# Patient Record
Sex: Male | Born: 1965 | Race: Black or African American | Hispanic: No | Marital: Single | Smoking: Former smoker
Health system: Southern US, Community
[De-identification: ages and names within clinical notes are randomized; demographics above are authoritative.]

## PROBLEM LIST (undated history)

## (undated) DIAGNOSIS — Z8719 Personal history of other diseases of the digestive system: Secondary | ICD-10-CM

## (undated) DIAGNOSIS — M1990 Unspecified osteoarthritis, unspecified site: Secondary | ICD-10-CM

## (undated) DIAGNOSIS — Z8711 Personal history of peptic ulcer disease: Secondary | ICD-10-CM

## (undated) DIAGNOSIS — I428 Other cardiomyopathies: Secondary | ICD-10-CM

## (undated) DIAGNOSIS — C801 Malignant (primary) neoplasm, unspecified: Secondary | ICD-10-CM

## (undated) DIAGNOSIS — I213 ST elevation (STEMI) myocardial infarction of unspecified site: Secondary | ICD-10-CM

## (undated) DIAGNOSIS — K219 Gastro-esophageal reflux disease without esophagitis: Secondary | ICD-10-CM

## (undated) DIAGNOSIS — Z87442 Personal history of urinary calculi: Secondary | ICD-10-CM

## (undated) DIAGNOSIS — I10 Essential (primary) hypertension: Secondary | ICD-10-CM

## (undated) HISTORY — PX: CARDIAC CATHETERIZATION: SHX172

---

## 1991-09-13 HISTORY — PX: EYE SURGERY: SHX253

## 2015-09-14 ENCOUNTER — Encounter (HOSPITAL_COMMUNITY): Payer: Self-pay | Admitting: *Deleted

## 2015-09-14 ENCOUNTER — Emergency Department (HOSPITAL_COMMUNITY): Payer: Self-pay

## 2015-09-14 ENCOUNTER — Emergency Department (HOSPITAL_COMMUNITY)
Admission: EM | Admit: 2015-09-14 | Discharge: 2015-09-14 | Disposition: A | Discharge status: Home / Self Care | Payer: Self-pay | Attending: Emergency Medicine | Admitting: Emergency Medicine

## 2015-09-14 DIAGNOSIS — R1032 Left lower quadrant pain: Secondary | ICD-10-CM | POA: Insufficient documentation

## 2015-09-14 DIAGNOSIS — I10 Essential (primary) hypertension: Secondary | ICD-10-CM | POA: Insufficient documentation

## 2015-09-14 DIAGNOSIS — Z8719 Personal history of other diseases of the digestive system: Secondary | ICD-10-CM | POA: Insufficient documentation

## 2015-09-14 DIAGNOSIS — Z7982 Long term (current) use of aspirin: Secondary | ICD-10-CM | POA: Insufficient documentation

## 2015-09-14 DIAGNOSIS — Z79899 Other long term (current) drug therapy: Secondary | ICD-10-CM | POA: Insufficient documentation

## 2015-09-14 DIAGNOSIS — Z87891 Personal history of nicotine dependence: Secondary | ICD-10-CM | POA: Insufficient documentation

## 2015-09-14 HISTORY — DX: Gastro-esophageal reflux disease without esophagitis: K21.9

## 2015-09-14 HISTORY — DX: Essential (primary) hypertension: I10

## 2015-09-14 LAB — CBC
HCT: 43.5 % (ref 39.0–52.0)
HEMOGLOBIN: 15.6 g/dL (ref 13.0–17.0)
MCH: 30.3 pg (ref 26.0–34.0)
MCHC: 35.9 g/dL (ref 30.0–36.0)
MCV: 84.5 fL (ref 78.0–100.0)
PLATELETS: 236 10*3/uL (ref 150–400)
RBC: 5.15 MIL/uL (ref 4.22–5.81)
RDW: 12.9 % (ref 11.5–15.5)
WBC: 6.2 10*3/uL (ref 4.0–10.5)

## 2015-09-14 LAB — COMPREHENSIVE METABOLIC PANEL
ALK PHOS: 55 U/L (ref 38–126)
ALT: 26 U/L (ref 17–63)
ANION GAP: 9 (ref 5–15)
AST: 29 U/L (ref 15–41)
Albumin: 3.9 g/dL (ref 3.5–5.0)
BUN: 17 mg/dL (ref 6–20)
CALCIUM: 9.3 mg/dL (ref 8.9–10.3)
CO2: 24 mmol/L (ref 22–32)
CREATININE: 1.23 mg/dL (ref 0.61–1.24)
Chloride: 108 mmol/L (ref 101–111)
Glucose, Bld: 110 mg/dL — ABNORMAL HIGH (ref 65–99)
Potassium: 4.5 mmol/L (ref 3.5–5.1)
SODIUM: 141 mmol/L (ref 135–145)
Total Bilirubin: 1 mg/dL (ref 0.3–1.2)
Total Protein: 6.6 g/dL (ref 6.5–8.1)

## 2015-09-14 LAB — URINALYSIS, ROUTINE W REFLEX MICROSCOPIC
BILIRUBIN URINE: NEGATIVE
GLUCOSE, UA: NEGATIVE mg/dL
HGB URINE DIPSTICK: NEGATIVE
KETONES UR: NEGATIVE mg/dL
LEUKOCYTES UA: NEGATIVE
NITRITE: NEGATIVE
PROTEIN: NEGATIVE mg/dL
SPECIFIC GRAVITY, URINE: 1.023 (ref 1.005–1.030)
pH: 6.5 (ref 5.0–8.0)

## 2015-09-14 LAB — LIPASE, BLOOD: LIPASE: 31 U/L (ref 11–51)

## 2015-09-14 MED ORDER — OXYCODONE-ACETAMINOPHEN 5-325 MG PO TABS: 1.0000 | ORAL_TABLET | ORAL | Status: DC | PRN

## 2015-09-14 MED ORDER — IOHEXOL 300 MG/ML  SOLN
100.0000 mL | Freq: Once | INTRAMUSCULAR | Status: AC | PRN
  Administered 2015-09-14: 100 mL via INTRAVENOUS

## 2015-09-14 NOTE — ED Notes (Signed)
Pt c/o abd pain x 30 days, worse after eating. States that his PCP saw him but did not order any tests or prescribe any medicine and told him that his pain was related to his acid reflux. .states he has diarrhea at times.
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
## 2015-09-14 NOTE — Discharge Instructions (Signed)
Test showed no life-threatening condition. Medication for pain. Follow-up your primary care doctor.

## 2017-02-02 IMAGING — CT CT ABD-PELV W/ CM
2 of 5 series · 11 of 46 positions shown, 12 images · IV contrast (Iodine)
Comparison: 04/20/2015.

CLINICAL DATA: Abdominal pain over the past month, worse after
eating. Occasional diarrhea. Left abdominal pain.

EXAM:
CT ABDOMEN AND PELVIS WITH CONTRAST
TECHNIQUE: Multidetector CT imaging of the abdomen and pelvis was performed
using the standard protocol following bolus administration of
intravenous contrast.
CONTRAST:  100mL OMNIPAQUE IOHEXOL 300 MG/ML  SOLN

[Series 201: routine, idose (2) · axial · 0.77mm/px · z∈[-480,-90]mm · 8 of 100 slices shown, 9 images]
[im 11/100  soft-tissue]
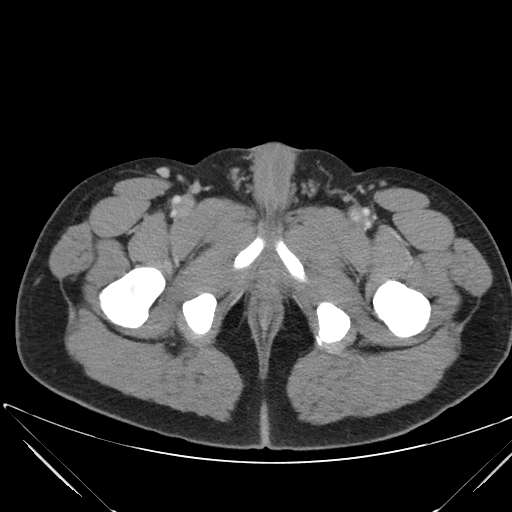
[im 11/100  bone]
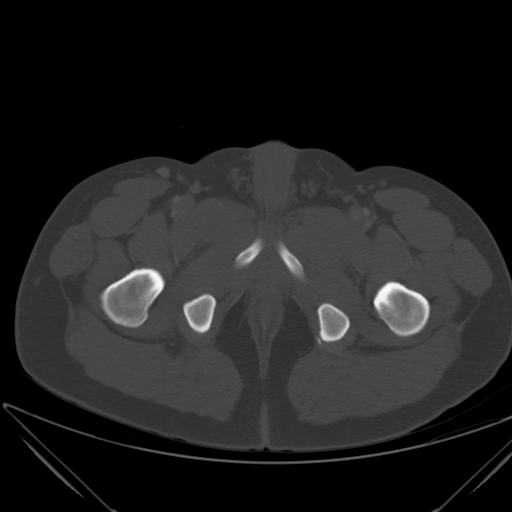
[im 21/100  soft-tissue]
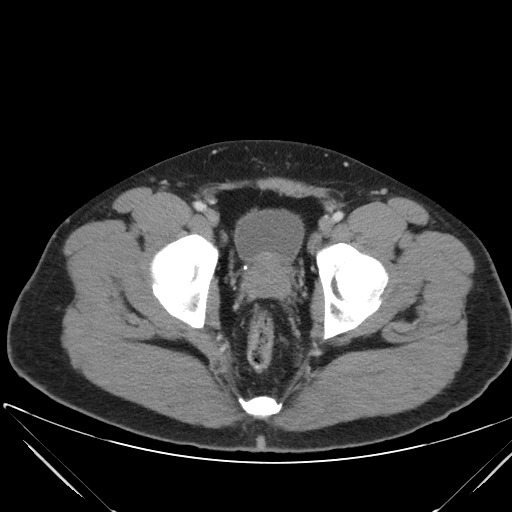
[im 32/100  soft-tissue]
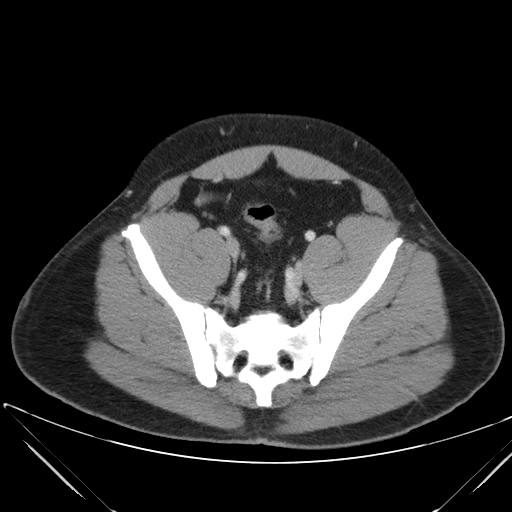
[im 42/100  soft-tissue]
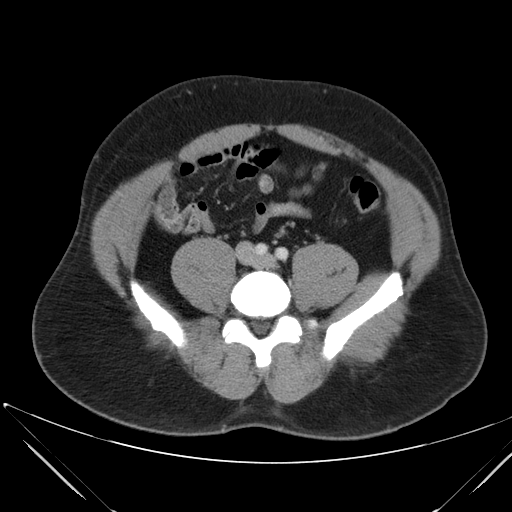
[im 58/100  soft-tissue]
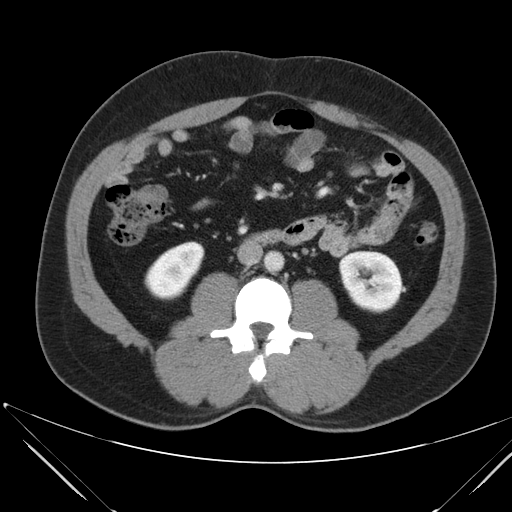
[im 68/100  soft-tissue]
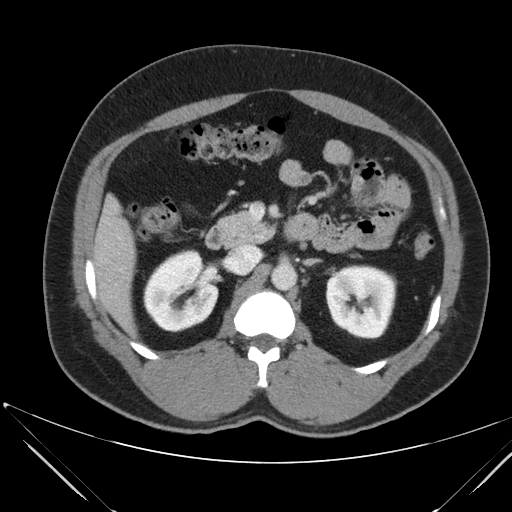
[im 79/100  soft-tissue]
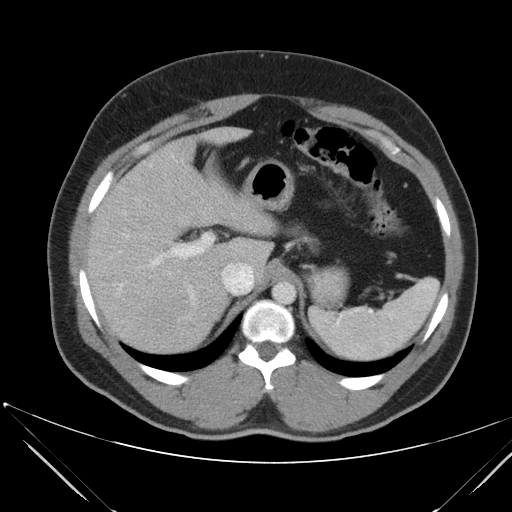
[im 89/100  soft-tissue]
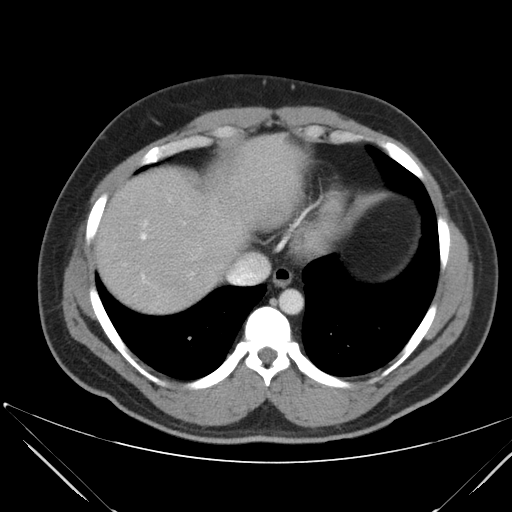

[Series 203: coronals, idose (2) · coronal · 0.45mm/px · 3 of 151 slices shown]
[im 51/151  soft-tissue]
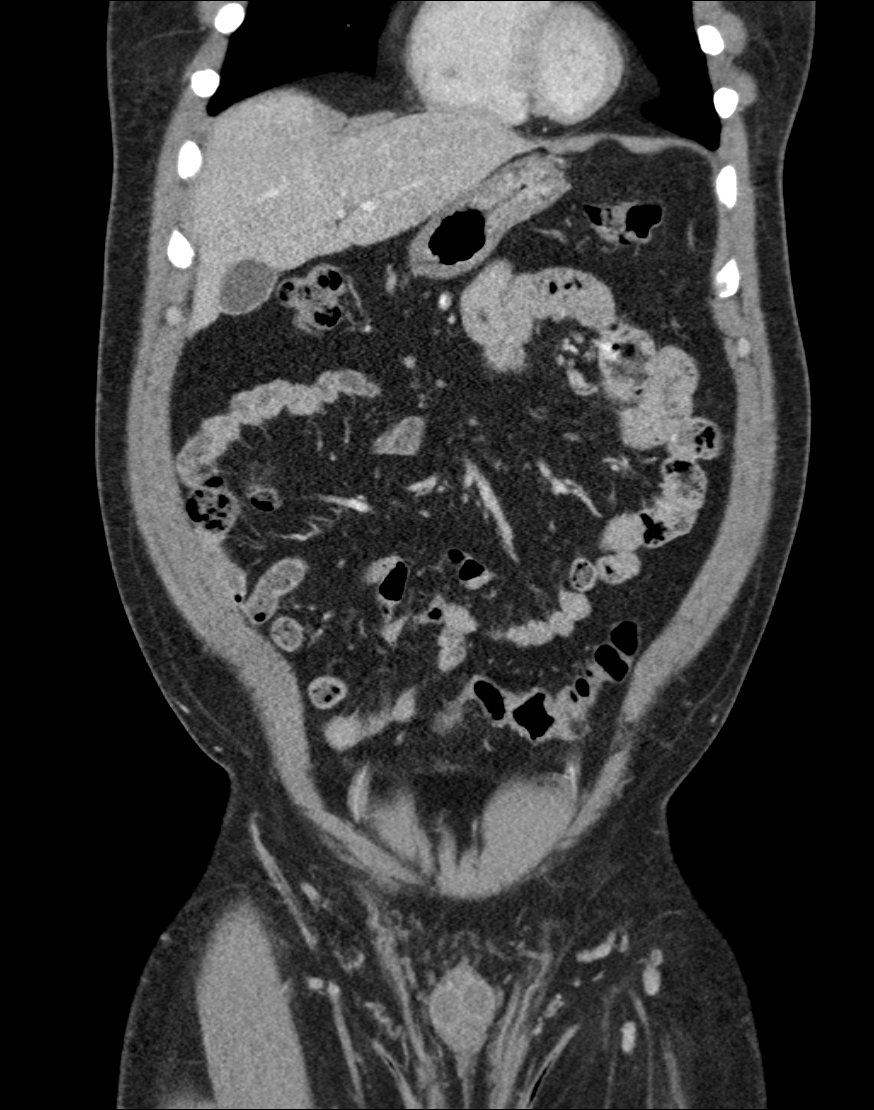
[im 67/151  soft-tissue]
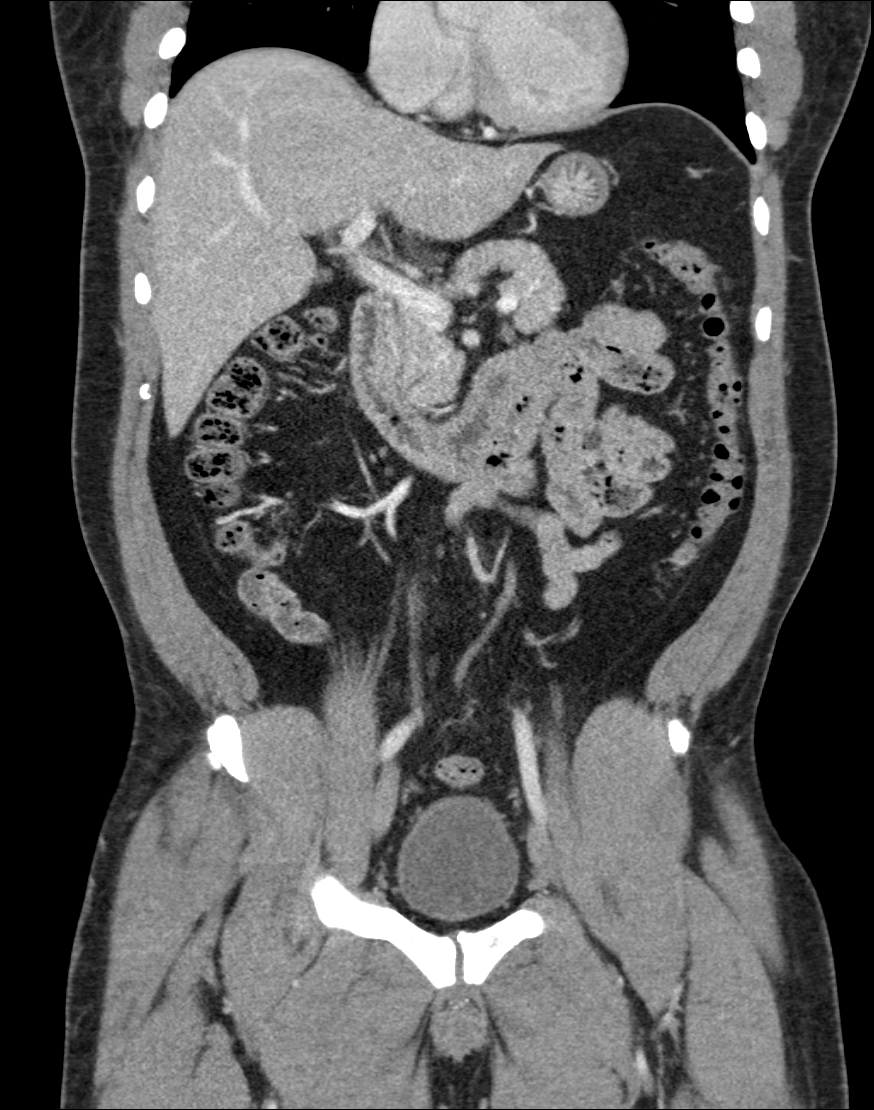
[im 84/151  soft-tissue]
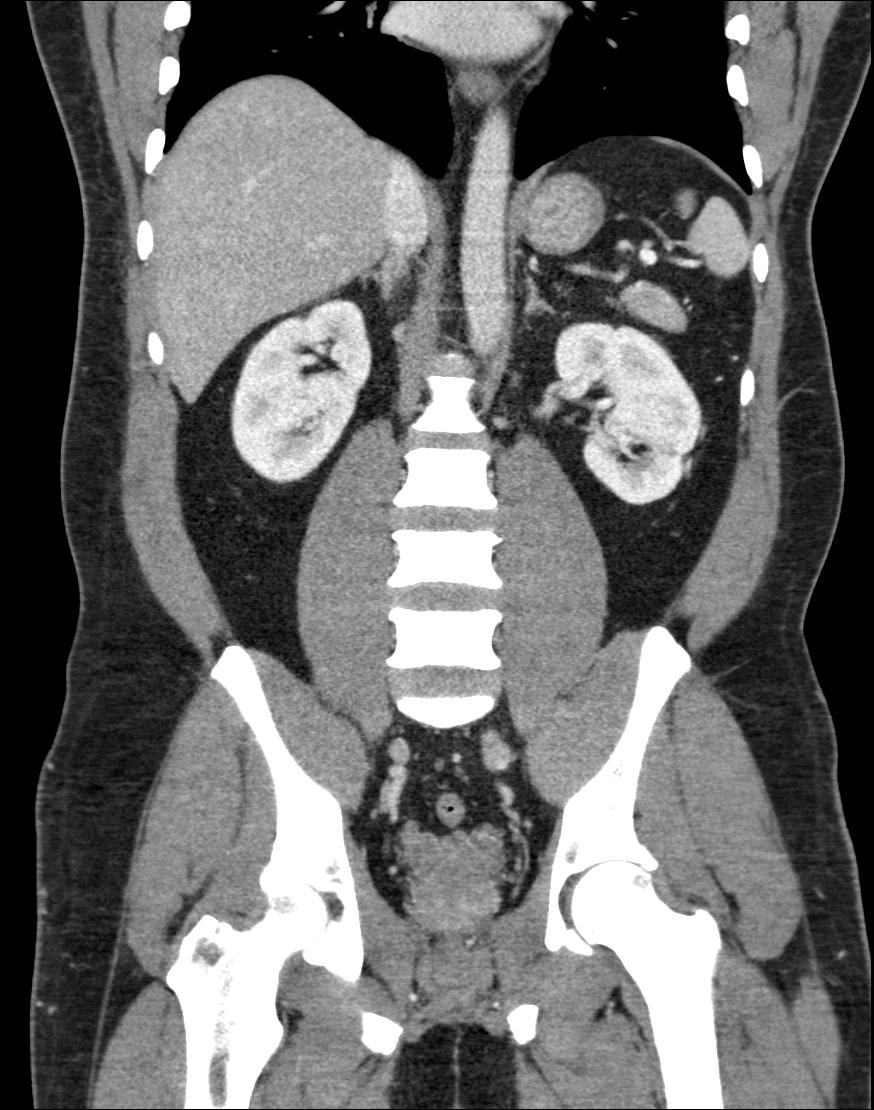

[11 of 46 positions shown; findings below may reference images not displayed]

FINDINGS: Lower chest:  Unremarkable.  No distal esophageal wall thickening.

Hepatobiliary: Unremarkable

Pancreas: Unremarkable

Spleen: Unremarkable

Adrenals/Urinary Tract: Unremarkable

Stomach/Bowel: Sigmoid colon diverticulosis without active
diverticulitis. Appendix normal. Scattered diverticula of the
transverse and descending colon.

Vascular/Lymphatic: Unremarkable

Reproductive: Unremarkable

Other: No supplemental non-categorized findings.

Musculoskeletal: Partially transitional S1 vertebra. Borderline left
foraminal stenosis at L5-S1 due to disc bulge and facet arthropathy.
IMPRESSION: 1. Sigmoid diverticulosis with scattered diverticula of the
transverse and descending colon, but no active diverticulitis.
2. No distal esophageal wall thickening is identified to suggest
esophagitis.
3. Borderline left foraminal stenosis at L5-S1 due to disc bulge and
facet arthropathy.

## 2017-09-13 ENCOUNTER — Observation Stay (HOSPITAL_COMMUNITY)
Admission: AD | Admit: 2017-09-13 | Discharge: 2017-09-14 | Disposition: A | Discharge status: Home / Self Care | Payer: BLUE CROSS/BLUE SHIELD | Source: Other Acute Inpatient Hospital | Attending: Cardiovascular Disease | Admitting: Cardiovascular Disease

## 2017-09-13 ENCOUNTER — Other Ambulatory Visit: Payer: Self-pay

## 2017-09-13 ENCOUNTER — Encounter (HOSPITAL_COMMUNITY): Payer: Self-pay | Admitting: Cardiovascular Disease

## 2017-09-13 ENCOUNTER — Encounter (HOSPITAL_COMMUNITY)
Admission: AD | Disposition: A | Discharge status: Home / Self Care | Payer: Self-pay | Source: Other Acute Inpatient Hospital | Attending: Cardiovascular Disease

## 2017-09-13 DIAGNOSIS — R072 Precordial pain: Secondary | ICD-10-CM

## 2017-09-13 DIAGNOSIS — Z8719 Personal history of other diseases of the digestive system: Secondary | ICD-10-CM

## 2017-09-13 DIAGNOSIS — Z79899 Other long term (current) drug therapy: Secondary | ICD-10-CM | POA: Insufficient documentation

## 2017-09-13 DIAGNOSIS — Z7982 Long term (current) use of aspirin: Secondary | ICD-10-CM | POA: Diagnosis not present

## 2017-09-13 DIAGNOSIS — R0789 Other chest pain: Secondary | ICD-10-CM

## 2017-09-13 DIAGNOSIS — R079 Chest pain, unspecified: Secondary | ICD-10-CM | POA: Diagnosis present

## 2017-09-13 DIAGNOSIS — R9431 Abnormal electrocardiogram [ECG] [EKG]: Secondary | ICD-10-CM | POA: Diagnosis not present

## 2017-09-13 DIAGNOSIS — Z8711 Personal history of peptic ulcer disease: Secondary | ICD-10-CM

## 2017-09-13 DIAGNOSIS — I213 ST elevation (STEMI) myocardial infarction of unspecified site: Secondary | ICD-10-CM

## 2017-09-13 DIAGNOSIS — K219 Gastro-esophageal reflux disease without esophagitis: Secondary | ICD-10-CM | POA: Diagnosis present

## 2017-09-13 DIAGNOSIS — Z79891 Long term (current) use of opiate analgesic: Secondary | ICD-10-CM | POA: Insufficient documentation

## 2017-09-13 DIAGNOSIS — I10 Essential (primary) hypertension: Secondary | ICD-10-CM | POA: Diagnosis present

## 2017-09-13 DIAGNOSIS — Z87891 Personal history of nicotine dependence: Secondary | ICD-10-CM | POA: Insufficient documentation

## 2017-09-13 HISTORY — DX: Unspecified osteoarthritis, unspecified site: M19.90

## 2017-09-13 HISTORY — DX: Personal history of other diseases of the digestive system: Z87.19

## 2017-09-13 HISTORY — DX: Other cardiomyopathies: I42.8

## 2017-09-13 HISTORY — PX: LEFT HEART CATH AND CORONARY ANGIOGRAPHY: CATH118249

## 2017-09-13 HISTORY — DX: ST elevation (STEMI) myocardial infarction of unspecified site: I21.3

## 2017-09-13 HISTORY — DX: Personal history of peptic ulcer disease: Z87.11

## 2017-09-13 SURGERY — LEFT HEART CATH AND CORONARY ANGIOGRAPHY
Anesthesia: LOCAL

## 2017-09-13 MED ORDER — HEPARIN SODIUM (PORCINE) 1000 UNIT/ML IJ SOLN
INTRAMUSCULAR | Status: DC | PRN
Start: 2017-09-13 — End: 2017-09-13
  Administered 2017-09-13: 5000 [IU] via INTRAVENOUS

## 2017-09-13 MED ORDER — MIDAZOLAM HCL 2 MG/2ML IJ SOLN
INTRAMUSCULAR | Status: DC | PRN
  Administered 2017-09-13: 2 mg via INTRAVENOUS

## 2017-09-13 MED ORDER — ONDANSETRON HCL 4 MG/2ML IJ SOLN: 4.0000 mg | Freq: Four times a day (QID) | INTRAMUSCULAR | Status: DC | PRN

## 2017-09-13 MED ORDER — HEPARIN (PORCINE) IN NACL 2-0.9 UNIT/ML-% IJ SOLN
INTRAMUSCULAR | Status: AC
  Filled 2017-09-13: qty 1500

## 2017-09-13 MED ORDER — ASPIRIN 81 MG PO CHEW: 81.0000 mg | CHEWABLE_TABLET | Freq: Every day | ORAL | Status: DC

## 2017-09-13 MED ORDER — HEPARIN SODIUM (PORCINE) 1000 UNIT/ML IJ SOLN
INTRAMUSCULAR | Status: AC
  Filled 2017-09-13: qty 1

## 2017-09-13 MED ORDER — ACETAMINOPHEN 325 MG PO TABS: 650.0000 mg | ORAL_TABLET | ORAL | Status: DC | PRN

## 2017-09-13 MED ORDER — VERAPAMIL HCL 2.5 MG/ML IV SOLN
INTRAVENOUS | Status: DC | PRN
  Administered 2017-09-13: 10 mL via INTRA_ARTERIAL

## 2017-09-13 MED ORDER — SODIUM CHLORIDE 0.9 % IV SOLN
INTRAVENOUS | Status: AC
  Administered 2017-09-13: 16:00:00 via INTRAVENOUS

## 2017-09-13 MED ORDER — LISINOPRIL 5 MG PO TABS: 2.5000 mg | ORAL_TABLET | Freq: Every day | ORAL | Status: DC

## 2017-09-13 MED ORDER — NITROGLYCERIN 1 MG/10 ML FOR IR/CATH LAB
INTRA_ARTERIAL | Status: AC
  Filled 2017-09-13: qty 10

## 2017-09-13 MED ORDER — SODIUM CHLORIDE 0.9% FLUSH: 3.0000 mL | INTRAVENOUS | Status: DC | PRN

## 2017-09-13 MED ORDER — FENTANYL CITRATE (PF) 100 MCG/2ML IJ SOLN
INTRAMUSCULAR | Status: DC | PRN
  Administered 2017-09-13: 50 ug via INTRAVENOUS

## 2017-09-13 MED ORDER — HEPARIN (PORCINE) IN NACL 2-0.9 UNIT/ML-% IJ SOLN
INTRAMUSCULAR | Status: AC | PRN
  Administered 2017-09-13: 1000 mL

## 2017-09-13 MED ORDER — CARVEDILOL 3.125 MG PO TABS
3.1250 mg | ORAL_TABLET | Freq: Two times a day (BID) | ORAL | Status: DC
  Administered 2017-09-13: 3.125 mg via ORAL
  Filled 2017-09-13: qty 1

## 2017-09-13 MED ORDER — VERAPAMIL HCL 2.5 MG/ML IV SOLN
INTRAVENOUS | Status: AC
  Filled 2017-09-13: qty 2

## 2017-09-13 MED ORDER — SODIUM CHLORIDE 0.9% FLUSH
3.0000 mL | Freq: Two times a day (BID) | INTRAVENOUS | Status: DC
  Administered 2017-09-13: 18:00:00 3 mL via INTRAVENOUS

## 2017-09-13 MED ORDER — IOPAMIDOL (ISOVUE-370) INJECTION 76%
INTRAVENOUS | Status: DC | PRN
  Administered 2017-09-13: 120 mL via INTRA_ARTERIAL

## 2017-09-13 MED ORDER — IOPAMIDOL (ISOVUE-370) INJECTION 76%
INTRAVENOUS | Status: AC
  Filled 2017-09-13: qty 50

## 2017-09-13 MED ORDER — MIDAZOLAM HCL 2 MG/2ML IJ SOLN
INTRAMUSCULAR | Status: AC
  Filled 2017-09-13: qty 2

## 2017-09-13 MED ORDER — SODIUM CHLORIDE 0.9 % IV SOLN: 250.0000 mL | INTRAVENOUS | Status: DC | PRN

## 2017-09-13 MED ORDER — LIDOCAINE HCL (PF) 1 % IJ SOLN
INTRAMUSCULAR | Status: DC | PRN
  Administered 2017-09-13: 3 mL

## 2017-09-13 MED ORDER — LIDOCAINE HCL (PF) 1 % IJ SOLN
INTRAMUSCULAR | Status: AC
  Filled 2017-09-13: qty 30

## 2017-09-13 MED ORDER — FENTANYL CITRATE (PF) 100 MCG/2ML IJ SOLN
INTRAMUSCULAR | Status: AC
  Filled 2017-09-13: qty 2

## 2017-09-13 SURGICAL SUPPLY — 14 items
CATH 5FR JL3.5 JR4 ANG PIG MP (CATHETERS) ×2 IMPLANT
CATH INFINITI 5FR AL1 (CATHETERS) ×2 IMPLANT
CATH LAUNCHER 5F RADR (CATHETERS) ×1 IMPLANT
CATHETER LAUNCHER 5F RADR (CATHETERS) ×2
DEVICE RAD COMP TR BAND LRG (VASCULAR PRODUCTS) ×2 IMPLANT
ELECT DEFIB PAD ADLT CADENCE (PAD) ×2 IMPLANT
GLIDESHEATH SLEND SS 6F .021 (SHEATH) ×2 IMPLANT
GUIDEWIRE INQWIRE 1.5J.035X260 (WIRE) ×2 IMPLANT
INQWIRE 1.5J .035X260CM (WIRE) ×4
KIT HEART LEFT (KITS) ×2 IMPLANT
PACK CARDIAC CATHETERIZATION (CUSTOM PROCEDURE TRAY) ×2 IMPLANT
SYR MEDRAD MARK V 150ML (SYRINGE) ×2 IMPLANT
TRANSDUCER W/STOPCOCK (MISCELLANEOUS) ×2 IMPLANT
TUBING CIL FLEX 10 FLL-RA (TUBING) ×2 IMPLANT
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
## 2017-09-13 NOTE — Progress Notes (Signed)
TR BAND REMOVAL  LOCATION:  right radial  DEFLATED PER PROTOCOL:  Yes.    TIME BAND OFF / DRESSING APPLIED:   1700   SITE UPON ARRIVAL:   Level 0  SITE AFTER BAND REMOVAL:  Level 0  CIRCULATION SENSATION AND MOVEMENT:  Within Normal Limits  Yes.    COMMENTS:    

## 2017-09-14 DIAGNOSIS — K219 Gastro-esophageal reflux disease without esophagitis: Secondary | ICD-10-CM | POA: Diagnosis present

## 2017-09-14 DIAGNOSIS — Z8719 Personal history of other diseases of the digestive system: Secondary | ICD-10-CM

## 2017-09-14 DIAGNOSIS — I10 Essential (primary) hypertension: Secondary | ICD-10-CM | POA: Diagnosis present

## 2017-09-14 DIAGNOSIS — R079 Chest pain, unspecified: Secondary | ICD-10-CM | POA: Diagnosis not present

## 2017-09-14 DIAGNOSIS — R072 Precordial pain: Secondary | ICD-10-CM | POA: Diagnosis not present

## 2017-09-14 DIAGNOSIS — R9431 Abnormal electrocardiogram [ECG] [EKG]: Secondary | ICD-10-CM | POA: Diagnosis not present

## 2017-09-14 DIAGNOSIS — Z8711 Personal history of peptic ulcer disease: Secondary | ICD-10-CM

## 2017-09-14 LAB — BASIC METABOLIC PANEL
ANION GAP: 8 (ref 5–15)
BUN: 13 mg/dL (ref 6–20)
CHLORIDE: 105 mmol/L (ref 101–111)
CO2: 24 mmol/L (ref 22–32)
Calcium: 8.8 mg/dL — ABNORMAL LOW (ref 8.9–10.3)
Creatinine, Ser: 1.28 mg/dL — ABNORMAL HIGH (ref 0.61–1.24)
GFR calc Af Amer: >60 mL/min (ref >60)
Glucose, Bld: 91 mg/dL (ref 65–99)
POTASSIUM: 3.9 mmol/L (ref 3.5–5.1)
SODIUM: 137 mmol/L (ref 135–145)

## 2017-09-14 LAB — CBC
HCT: 42.9 % (ref 39.0–52.0)
HEMOGLOBIN: 14.8 g/dL (ref 13.0–17.0)
MCH: 29.6 pg (ref 26.0–34.0)
MCHC: 34.5 g/dL (ref 30.0–36.0)
MCV: 85.8 fL (ref 78.0–100.0)
PLATELETS: 260 10*3/uL (ref 150–400)
RBC: 5 MIL/uL (ref 4.22–5.81)
RDW: 13.6 % (ref 11.5–15.5)
WBC: 7.8 10*3/uL (ref 4.0–10.5)

## 2017-09-14 NOTE — Progress Notes (Signed)
   Progress Note  Patient Name: Gregory Parsons Date of Encounter: 09/14/2017  Primary Cardiologist: Clifton James  Subjective   No chest pain  Inpatient Medications    Scheduled Meds: . aspirin  81 mg Oral Daily  . carvedilol  3.125 mg Oral BID WC  . lisinopril  2.5 mg Oral Daily  . sodium chloride flush  3 mL Intravenous Q12H   Continuous Infusions: . sodium chloride     PRN Meds: sodium chloride, acetaminophen, ondansetron (ZOFRAN) IV, sodium chloride flush   Vital Signs    Vitals:   09/13/17 1930 09/13/17 2000 09/14/17 0420 09/14/17 0731  BP: 122/75  123/71 125/80  Pulse: 83 69 65 67  Resp: 16 18 17  (!) 21  Temp: 98.7 F (37.1 C)  (!) 97.5 F (36.4 C) 97.7 F (36.5 C)  TempSrc: Oral  Oral Oral  SpO2: 98% 98% 100% 98%  Weight:   202 lb 9.6 oz (91.9 kg)     Intake/Output Summary (Last 24 hours) at 09/14/2017 0740 Last data filed at 09/13/2017 2210 Gross per 24 hour  Intake 577.5 ml  Output -  Net 577.5 ml   Filed Weights   09/14/17 0420  Weight: 202 lb 9.6 oz (91.9 kg)    Telemetry    sinus - Personally Reviewed  ECG    Sinus, ST elevation leads 2, AVF, V4-V6-This is early repol and his baseline - Personally Reviewed  Physical Exam   GEN: No acute distress.   Neck: No JVD Cardiac: RRR, no murmurs, rubs, or gallops.  Respiratory: Clear to auscultation bilaterally. GI: Soft, nontender, non-distended  MS: No edema; No deformity. Neuro:  Nonfocal  Psych: Normal affect   Labs    Chemistry Recent Labs  Lab 09/14/17 0347  NA 137  K 3.9  CL 105  CO2 24  GLUCOSE 91  BUN 13  CREATININE 1.28*  CALCIUM 8.8*  GFRNONAA >60  GFRAA >60  ANIONGAP 8     Hematology Recent Labs  Lab 09/14/17 0347  WBC 7.8  RBC 5.00  HGB 14.8  HCT 42.9  MCV 85.8  MCH 29.6  MCHC 34.5  RDW 13.6  PLT 260    Cardiac EnzymesNo results for input(s): TROPONINI in the last 168 hours. No results for input(s): TROPIPOC in the last 168 hours.   BNPNo results for  input(s): BNP, PROBNP in the last 168 hours.   DDimer No results for input(s): DDIMER in the last 168 hours.   Radiology    No results found.  Cardiac Studies   Cardiac cath 09/13/16: No evidence of CAD Normal LV systolic function  Patient Profile     52 y.o. male with h/o of HTN admitted with chest pain. Cardiac cath with no CAD.   Assessment & Plan    1. Chest pain: Pt admitted 09/13/16 following episode of chest pain with abnormal EKG. Emergent cardiac cath with no CAD. No recurrent pain. Chest pain is felt to be non-cardiac. Will discharge home today.   He will need a work note to stay out until Monday due to radial cath. Pt will need a copy of his EKG to keep in his wallet.   For questions or updates, please contact [facility] Please consult [email or web address] for contact info under Cardiology/STEMI.      Signed, Verne Carrow, MD  09/14/2017, 7:40 AM

## 2017-09-14 NOTE — Discharge Summary (Signed)
Discharge Summary    Patient ID: Gregory Parsons,  MRN: [id number], DOB/AGE: 53-Aug-1967 53 y.o.  Admit date: 09/13/2017 Discharge date: 09/14/2017   Primary Care Provider: [facility], [facility] Primary Cardiologist: [facility]  Discharge Diagnoses    Principal Problem:   Chest pain Active Problems:   Abnormal EKG   Precordial pain   Chest pain of unknown etiology   Hypertension   History of stomach ulcers   Acid reflux   Allergies No Known Allergies   History of Present Illness     Gregory Parsons is a 52 y.o. male with a history of HTN and presumed non-ischemic cardiomyopathy with remote tobacco abuse who presented to [facility] ED today with c/o chest pain and dyspnea. EKG with 1 mm ST elevation leads 2, aVF. Code STEMI called by the ED staff. Pt transported to [facility] for cardiac cath.    Hospital Course     Consultants: none  Pt was taken emergently to the cath lab for heart catheterization and was found to have normal coronaries and normal LV function. This chest pain was felt to be non-cardiac in nature. He was observed on telemetry overnight without significant chest pain or arrhythmias. Today, he denies chest pain and is ready for discharge.  Patient seen and examined by Dr. Clifton James today and was stable for discharge. All follow up has been arranged.  _____________  Discharge Vitals Blood pressure 125/80, pulse 67, temperature 97.7 F (36.5 C), temperature source Oral, resp. rate (!) 21, weight 202 lb 9.6 oz (91.9 kg), SpO2 98 %.  Filed Weights   09/14/17 0420  Weight: 202 lb 9.6 oz (91.9 kg)    Labs & Radiologic Studies    CBC Recent Labs    09/14/17 0347  WBC 7.8  HGB 14.8  HCT 42.9  MCV 85.8  PLT 260   Basic Metabolic Panel Recent Labs    40/98/11 0347  NA 137  K 3.9  CL 105  CO2 24  GLUCOSE 91  BUN 13  CREATININE 1.28*  CALCIUM 8.8*   Liver Function Tests No results for input(s): AST, ALT, ALKPHOS, BILITOT, PROT,  ALBUMIN in the last 72 hours. No results for input(s): LIPASE, AMYLASE in the last 72 hours. Cardiac Enzymes No results for input(s): CKTOTAL, CKMB, CKMBINDEX, TROPONINI in the last 72 hours. BNP Invalid input(s): POCBNP D-Dimer No results for input(s): DDIMER in the last 72 hours. Hemoglobin A1C No results for input(s): HGBA1C in the last 72 hours. Fasting Lipid Panel No results for input(s): CHOL, HDL, LDLCALC, TRIG, CHOLHDL, LDLDIRECT in the last 72 hours. Thyroid Function Tests No results for input(s): TSH, T4TOTAL, T3FREE, THYROIDAB in the last 72 hours.  Invalid input(s): FREET3 _____________  No results found.   Diagnostic Studies/Procedures    Left heart cath 09/13/17:  The left ventricular systolic function is normal.  LV end diastolic pressure is normal.  The left ventricular ejection fraction is greater than 65% by visual estimate.  There is no mitral valve regurgitation.   1. No angiographic evidence of CAD 2. Normal LV systolic function 3. Non-cardiac chest pain  Recommendations: Will monitor overnight on telemetry. His chest pain is not felt to be cardiac related. Will continue ASA, beta blocker and Ace-inh. Likely discharge home in the am.    Disposition   Pt is being discharged home today in good condition.  Follow-up Plans & Appointments   Follow up with Dr. Misty [facility] at [facility]  Discharge Instructions    Diet -  low sodium heart healthy   Complete by:  As directed    Discharge instructions   Complete by:  As directed    No driving for 2 days. No lifting over 5 lbs for 1 week. No sexual activity for 1 week. You may return to work in 1 week. Keep procedure site clean & dry. If you notice increased pain, swelling, bleeding or pus, call/return!  You may shower, but no soaking baths/hot tubs/pools for 1 week.   Increase activity slowly   Complete by:  As directed       Discharge Medications   Allergies as of 09/14/2017   No Known Allergies       Medication List    TAKE these medications   aspirin 325 MG tablet Take 325 mg by mouth daily.   carvedilol 3.125 MG tablet Commonly known as:  COREG Take 3.125 mg by mouth 2 (two) times daily with a meal.   lisinopril 2.5 MG tablet Commonly known as:  PRINIVIL,ZESTRIL Take 2.5 mg by mouth daily.          Outstanding Labs/Studies   Follow up with [facility] cardiologist.  Duration of Discharge Encounter   Greater than 30 minutes including physician time.  Signed, Roe Rutherford Duke PA-C 09/14/2017, 9:37 AM

## 2018-05-25 ENCOUNTER — Emergency Department (HOSPITAL_COMMUNITY): Payer: BLUE CROSS/BLUE SHIELD

## 2018-05-25 ENCOUNTER — Emergency Department (HOSPITAL_COMMUNITY)
Admission: EM | Admit: 2018-05-25 | Discharge: 2018-05-25 | Disposition: A | Discharge status: Home / Self Care | Payer: BLUE CROSS/BLUE SHIELD | Attending: Emergency Medicine | Admitting: Emergency Medicine

## 2018-05-25 ENCOUNTER — Encounter (HOSPITAL_COMMUNITY): Payer: Self-pay | Admitting: Emergency Medicine

## 2018-05-25 DIAGNOSIS — Z7982 Long term (current) use of aspirin: Secondary | ICD-10-CM | POA: Diagnosis not present

## 2018-05-25 DIAGNOSIS — I10 Essential (primary) hypertension: Secondary | ICD-10-CM | POA: Insufficient documentation

## 2018-05-25 DIAGNOSIS — R079 Chest pain, unspecified: Secondary | ICD-10-CM | POA: Diagnosis not present

## 2018-05-25 DIAGNOSIS — Z87891 Personal history of nicotine dependence: Secondary | ICD-10-CM | POA: Insufficient documentation

## 2018-05-25 DIAGNOSIS — Z79899 Other long term (current) drug therapy: Secondary | ICD-10-CM | POA: Diagnosis not present

## 2018-05-25 LAB — I-STAT TROPONIN, ED
Troponin i, poc: 0 ng/mL (ref 0.00–0.08)
Troponin i, poc: 0 ng/mL (ref 0.00–0.08)

## 2018-05-25 LAB — CBC
HEMATOCRIT: 41.2 % (ref 39.0–52.0)
HEMOGLOBIN: 14.4 g/dL (ref 13.0–17.0)
MCH: 29.9 pg (ref 26.0–34.0)
MCHC: 35 g/dL (ref 30.0–36.0)
MCV: 85.5 fL (ref 78.0–100.0)
Platelets: 207 10*3/uL (ref 150–400)
RBC: 4.82 MIL/uL (ref 4.22–5.81)
RDW: 12.3 % (ref 11.5–15.5)
WBC: 4.8 10*3/uL (ref 4.0–10.5)

## 2018-05-25 LAB — BASIC METABOLIC PANEL
ANION GAP: 9 (ref 5–15)
BUN: 16 mg/dL (ref 6–20)
CHLORIDE: 108 mmol/L (ref 98–111)
CO2: 20 mmol/L — AB (ref 22–32)
Calcium: 8.7 mg/dL — ABNORMAL LOW (ref 8.9–10.3)
Creatinine, Ser: 1.15 mg/dL (ref 0.61–1.24)
GFR calc Af Amer: >60 mL/min (ref >60)
GFR calc non Af Amer: >60 mL/min (ref >60)
Glucose, Bld: 118 mg/dL — ABNORMAL HIGH (ref 70–99)
POTASSIUM: 4.2 mmol/L (ref 3.5–5.1)
Sodium: 137 mmol/L (ref 135–145)

## 2018-05-25 NOTE — ED Notes (Signed)
Discharge instructions discussed with Pt. Pt verbalized understanding. Pt stable and ambulatory.    

## 2018-05-25 NOTE — ED Notes (Signed)
Patient transported to X-ray 

## 2018-05-25 NOTE — ED Triage Notes (Signed)
Pt here via [facility], pt reports he fell around 1350 but does not remember why and then he began experiencing central CP that radiated to his left arm and back.  EMS reported ST elevation but STEMI was cancelled.  EMS gave 324 ASA, 0.4 Nitro. VS EMS 159/82, P 108, 98% RA, 114 CBG.

## 2018-05-25 NOTE — Discharge Instructions (Addendum)
You were evaluated in the emergency department for some sharp left-sided chest pain.  You had blood work EKG and chest x-ray did not show an obvious cause of your symptoms.  It will be important for you to follow-up with your doctor and return if any worsening symptoms.
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
## 2018-07-19 ENCOUNTER — Other Ambulatory Visit: Payer: Self-pay

## 2018-07-19 ENCOUNTER — Emergency Department (HOSPITAL_COMMUNITY)
Admission: EM | Admit: 2018-07-19 | Discharge: 2018-07-19 | Disposition: A | Discharge status: Home / Self Care | Payer: BLUE CROSS/BLUE SHIELD | Attending: Emergency Medicine | Admitting: Emergency Medicine

## 2018-07-19 ENCOUNTER — Encounter (HOSPITAL_COMMUNITY): Payer: Self-pay | Admitting: *Deleted

## 2018-07-19 ENCOUNTER — Emergency Department (HOSPITAL_COMMUNITY): Payer: BLUE CROSS/BLUE SHIELD

## 2018-07-19 DIAGNOSIS — W108XXA Fall (on) (from) other stairs and steps, initial encounter: Secondary | ICD-10-CM | POA: Diagnosis not present

## 2018-07-19 DIAGNOSIS — Z79899 Other long term (current) drug therapy: Secondary | ICD-10-CM | POA: Insufficient documentation

## 2018-07-19 DIAGNOSIS — Y93E6 Activity, residential relocation: Secondary | ICD-10-CM | POA: Diagnosis not present

## 2018-07-19 DIAGNOSIS — Y998 Other external cause status: Secondary | ICD-10-CM | POA: Diagnosis not present

## 2018-07-19 DIAGNOSIS — Z87891 Personal history of nicotine dependence: Secondary | ICD-10-CM | POA: Insufficient documentation

## 2018-07-19 DIAGNOSIS — Y929 Unspecified place or not applicable: Secondary | ICD-10-CM | POA: Diagnosis not present

## 2018-07-19 DIAGNOSIS — Z7982 Long term (current) use of aspirin: Secondary | ICD-10-CM | POA: Insufficient documentation

## 2018-07-19 DIAGNOSIS — I10 Essential (primary) hypertension: Secondary | ICD-10-CM | POA: Diagnosis not present

## 2018-07-19 DIAGNOSIS — S46911A Strain of unspecified muscle, fascia and tendon at shoulder and upper arm level, right arm, initial encounter: Secondary | ICD-10-CM | POA: Diagnosis not present

## 2018-07-19 DIAGNOSIS — S4991XA Unspecified injury of right shoulder and upper arm, initial encounter: Secondary | ICD-10-CM | POA: Diagnosis present

## 2018-07-19 DIAGNOSIS — R52 Pain, unspecified: Secondary | ICD-10-CM

## 2018-07-19 MED ORDER — NAPROXEN 500 MG PO TABS: 500.0000 mg | ORAL_TABLET | Freq: Two times a day (BID) | ORAL | 0 refills | Status: DC

## 2018-07-19 MED ORDER — HYDROCODONE-ACETAMINOPHEN 7.5-325 MG PO TABS: 1.0000 | ORAL_TABLET | Freq: Four times a day (QID) | ORAL | 0 refills | Status: DC | PRN

## 2018-07-19 NOTE — ED Triage Notes (Signed)
Pt states he was helping move furniture x 3 days ago and fell down some stairs injuring his right shoulder; pt states he has pain with rom

## 2018-07-19 NOTE — Discharge Instructions (Addendum)
Apply ice packs on and off to your shoulder.  Avoid heavy lifting with your right arm.  Call Dr. Mort Sawyers office to arrange follow-up appointment.
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
## 2019-03-06 ENCOUNTER — Other Ambulatory Visit: Payer: Self-pay | Admitting: Orthopedic Surgery

## 2019-03-06 ENCOUNTER — Telehealth: Payer: Self-pay | Admitting: Nurse Practitioner

## 2019-03-06 DIAGNOSIS — M5412 Radiculopathy, cervical region: Secondary | ICD-10-CM

## 2019-03-06 NOTE — Telephone Encounter (Signed)
Phone call to patient to verify medication list and allergies for myelogram procedure. Pt aware he will not need to hold any medications for this procedure. Pre and post procedure instructions reviewed with pt. 

## 2019-03-29 ENCOUNTER — Ambulatory Visit
Admission: RE | Admit: 2019-03-29 | Discharge: 2019-03-29 | Disposition: A | Discharge status: Home / Self Care | Payer: BC Managed Care – PPO | Source: Ambulatory Visit | Attending: Orthopedic Surgery | Admitting: Orthopedic Surgery

## 2019-03-29 ENCOUNTER — Other Ambulatory Visit: Payer: Self-pay

## 2019-03-29 DIAGNOSIS — M5412 Radiculopathy, cervical region: Secondary | ICD-10-CM

## 2019-03-29 MED ORDER — IOPAMIDOL (ISOVUE-M 300) INJECTION 61%
10.0000 mL | Freq: Once | INTRAMUSCULAR | Status: AC | PRN
Start: 2019-03-29 — End: 2019-03-29
  Administered 2019-03-29: 10:00:00 10 mL via INTRATHECAL

## 2019-03-29 MED ORDER — DIAZEPAM 5 MG PO TABS
10.0000 mg | ORAL_TABLET | Freq: Once | ORAL | Status: AC
  Administered 2019-03-29: 09:00:00 10 mg via ORAL

## 2019-03-29 NOTE — Discharge Instructions (Signed)

## 2019-07-01 ENCOUNTER — Ambulatory Visit (HOSPITAL_COMMUNITY): Admit: 2019-07-01 | Payer: BC Managed Care – PPO | Admitting: Cardiology

## 2019-07-01 ENCOUNTER — Encounter (HOSPITAL_COMMUNITY): Payer: Self-pay

## 2019-07-01 SURGERY — LEFT HEART CATH AND CORONARY ANGIOGRAPHY
Anesthesia: LOCAL

## 2019-08-20 ENCOUNTER — Emergency Department (HOSPITAL_COMMUNITY): Payer: BC Managed Care – PPO

## 2019-08-20 ENCOUNTER — Other Ambulatory Visit: Payer: Self-pay

## 2019-08-20 ENCOUNTER — Encounter (HOSPITAL_COMMUNITY): Payer: Self-pay | Admitting: *Deleted

## 2019-08-20 ENCOUNTER — Emergency Department (HOSPITAL_COMMUNITY)
Admission: EM | Admit: 2019-08-20 | Discharge: 2019-08-20 | Disposition: A | Discharge status: Home / Self Care | Payer: BC Managed Care – PPO | Attending: Emergency Medicine | Admitting: Emergency Medicine

## 2019-08-20 DIAGNOSIS — I10 Essential (primary) hypertension: Secondary | ICD-10-CM | POA: Diagnosis not present

## 2019-08-20 DIAGNOSIS — Z87891 Personal history of nicotine dependence: Secondary | ICD-10-CM | POA: Diagnosis not present

## 2019-08-20 DIAGNOSIS — R0789 Other chest pain: Secondary | ICD-10-CM | POA: Diagnosis not present

## 2019-08-20 DIAGNOSIS — R079 Chest pain, unspecified: Secondary | ICD-10-CM

## 2019-08-20 DIAGNOSIS — Z79899 Other long term (current) drug therapy: Secondary | ICD-10-CM | POA: Diagnosis not present

## 2019-08-20 DIAGNOSIS — J310 Chronic rhinitis: Secondary | ICD-10-CM | POA: Diagnosis not present

## 2019-08-20 DIAGNOSIS — Z7982 Long term (current) use of aspirin: Secondary | ICD-10-CM | POA: Insufficient documentation

## 2019-08-20 LAB — CBC
HCT: 46.4 % (ref 39.0–52.0)
Hemoglobin: 16.2 g/dL (ref 13.0–17.0)
MCH: 29.2 pg (ref 26.0–34.0)
MCHC: 34.9 g/dL (ref 30.0–36.0)
MCV: 83.8 fL (ref 80.0–100.0)
Platelets: 296 10*3/uL (ref 150–400)
RBC: 5.54 MIL/uL (ref 4.22–5.81)
RDW: 12.8 % (ref 11.5–15.5)
WBC: 5.2 10*3/uL (ref 4.0–10.5)
nRBC: 0 % (ref 0.0–0.2)

## 2019-08-20 LAB — BASIC METABOLIC PANEL
Anion gap: 9 (ref 5–15)
BUN: 18 mg/dL (ref 6–20)
CO2: 25 mmol/L (ref 22–32)
Calcium: 9.4 mg/dL (ref 8.9–10.3)
Chloride: 104 mmol/L (ref 98–111)
Creatinine, Ser: 1.17 mg/dL (ref 0.61–1.24)
GFR calc Af Amer: >60 mL/min (ref >60)
GFR calc non Af Amer: >60 mL/min (ref >60)
Glucose, Bld: 142 mg/dL — ABNORMAL HIGH (ref 70–99)
Potassium: 4 mmol/L (ref 3.5–5.1)
Sodium: 138 mmol/L (ref 135–145)

## 2019-08-20 LAB — TROPONIN I (HIGH SENSITIVITY)
Troponin I (High Sensitivity): 5 ng/L (ref <18)
Troponin I (High Sensitivity): 3 ng/L (ref <18)

## 2019-08-20 MED ORDER — FLUTICASONE PROPIONATE 50 MCG/ACT NA SUSP: 1.0000 | Freq: Two times a day (BID) | NASAL | 2 refills | Status: DC

## 2019-08-20 MED ORDER — SODIUM CHLORIDE 0.9% FLUSH
3.0000 mL | Freq: Once | INTRAVENOUS | Status: AC
  Administered 2019-08-20: 15:00:00 3 mL via INTRAVENOUS

## 2019-08-20 NOTE — ED Notes (Signed)
Patient verbalizes understanding of discharge instructions. Opportunity for questioning and answers were provided. Armband removed by staff, pt discharged from ED.  

## 2019-08-20 NOTE — Discharge Instructions (Addendum)
There were no complications seen from the chest pain you are having.  It does not appear to be caused from your heart.  You can try taking Tylenol or Motrin for this pain.  We are prescribing a medicine to treat your ear pain and congestion, which should improve after using the nasal spray.  Follow-up with your primary care doctor for problems.
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
## 2019-08-20 NOTE — ED Triage Notes (Signed)
Pt reports pain to left side of chest from under his arm to mid chest. Has occ sob, not at this time. Started on pepcid yesterday with no relief. ekg done at triage.

## 2020-03-31 ENCOUNTER — Encounter: Payer: Self-pay | Admitting: Internal Medicine

## 2020-05-07 ENCOUNTER — Other Ambulatory Visit: Payer: Self-pay

## 2020-05-07 ENCOUNTER — Encounter: Payer: Self-pay | Admitting: Gastroenterology

## 2020-05-07 ENCOUNTER — Ambulatory Visit: Payer: BC Managed Care – PPO | Admitting: Gastroenterology

## 2020-05-07 VITALS — BP 154/97 | HR 80 | Temp 97.1°F | Ht 64.0 in | Wt 204.8 lb

## 2020-05-07 DIAGNOSIS — K219 Gastro-esophageal reflux disease without esophagitis: Secondary | ICD-10-CM | POA: Diagnosis not present

## 2020-05-07 DIAGNOSIS — Z1211 Encounter for screening for malignant neoplasm of colon: Secondary | ICD-10-CM | POA: Insufficient documentation
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
## 2020-05-07 NOTE — Patient Instructions (Addendum)
We are arranging a colonoscopy in the near future with Dr. Marletta Lor.   Continue Protonix twice a day, 30 minutes before breakfast and dinner. Please call if any further abdominal pain, nausea, vomiting, or problems swallowing.  We will see you in 6-8 months!  It was a pleasure to see you today. I want to create trusting relationships with patients to provide genuine, compassionate, and quality care. I value your feedback. If you receive a survey regarding your visit,  I greatly appreciate you taking time to fill this out.   Gelene Mink, PhD, ANP-BC [facility]    Food Choices for Gastroesophageal Reflux Disease, Adult When you have gastroesophageal reflux disease (GERD), the foods you eat and your eating habits are very important. Choosing the right foods can help ease your discomfort. Think about working with a nutrition specialist (dietitian) to help you make good choices. What are tips for following this plan?  Meals  Choose healthy foods that are low in fat, such as fruits, vegetables, whole grains, low-fat dairy products, and lean meat, fish, and poultry.  Eat small meals often instead of 3 large meals a day. Eat your meals slowly, and in a place where you are relaxed. Avoid bending over or lying down until 2-3 hours after eating.  Avoid eating meals 2-3 hours before bed.  Avoid drinking a lot of liquid with meals.  Cook foods using methods other than frying. Bake, grill, or broil food instead.  Avoid or limit: ? Chocolate. ? Peppermint or spearmint. ? Alcohol. ? Pepper. ? Black and decaffeinated coffee. ? Black and decaffeinated tea. ? Bubbly (carbonated) soft drinks. ? Caffeinated energy drinks and soft drinks.  Limit high-fat foods such as: ? Fatty meat or fried foods. ? Whole milk, cream, butter, or ice cream. ? Nuts and nut butters. ? Pastries, donuts, and sweets made with butter or shortening.  Avoid foods that cause symptoms. These foods may be  different for everyone. Common foods that cause symptoms include: ? Tomatoes. ? Oranges, lemons, and limes. ? Peppers. ? Spicy food. ? Onions and garlic. ? Vinegar. Lifestyle  Maintain a healthy weight. Ask your doctor what weight is healthy for you. If you need to lose weight, work with your doctor to do so safely.  Exercise for at least 30 minutes for 5 or more days each week, or as told by your doctor.  Wear loose-fitting clothes.  Do not smoke. If you need help quitting, ask your doctor.  Sleep with the head of your bed higher than your feet. Use a wedge under the mattress or blocks under the bed frame to raise the head of the bed. Summary  When you have gastroesophageal reflux disease (GERD), food and lifestyle choices are very important in easing your symptoms.  Eat small meals often instead of 3 large meals a day. Eat your meals slowly, and in a place where you are relaxed.  Limit high-fat foods such as fatty meat or fried foods.  Avoid bending over or lying down until 2-3 hours after eating.  Avoid peppermint and spearmint, caffeine, alcohol, and chocolate. This information is not intended to replace advice given to you by your health care provider. Make sure you discuss any questions you have with your health care provider. Document Revised: 12/20/2018 Document Reviewed: 10/04/2016 Elsevier Patient Education  2020 [employer].

## 2020-05-14 ENCOUNTER — Other Ambulatory Visit: Payer: Self-pay

## 2020-05-29 ENCOUNTER — Other Ambulatory Visit (HOSPITAL_COMMUNITY)
Admission: RE | Admit: 2020-05-29 | Discharge: 2020-05-29 | Disposition: A | Discharge status: Home / Self Care | Payer: BC Managed Care – PPO | Source: Ambulatory Visit | Attending: Internal Medicine | Admitting: Internal Medicine

## 2020-05-29 ENCOUNTER — Other Ambulatory Visit: Payer: Self-pay

## 2020-05-29 DIAGNOSIS — Z20822 Contact with and (suspected) exposure to covid-19: Secondary | ICD-10-CM | POA: Insufficient documentation

## 2020-05-29 DIAGNOSIS — Z01812 Encounter for preprocedural laboratory examination: Secondary | ICD-10-CM | POA: Diagnosis present

## 2020-05-29 LAB — SARS CORONAVIRUS 2 (TAT 6-24 HRS): SARS Coronavirus 2: NEGATIVE

## 2020-06-01 ENCOUNTER — Ambulatory Visit (HOSPITAL_COMMUNITY): Payer: BC Managed Care – PPO | Admitting: Anesthesiology

## 2020-06-01 ENCOUNTER — Ambulatory Visit (HOSPITAL_COMMUNITY)
Admission: RE | Admit: 2020-06-01 | Discharge: 2020-06-01 | Disposition: A | Discharge status: Home / Self Care | Payer: BC Managed Care – PPO | Attending: Internal Medicine | Admitting: Internal Medicine

## 2020-06-01 ENCOUNTER — Other Ambulatory Visit: Payer: Self-pay

## 2020-06-01 ENCOUNTER — Encounter (HOSPITAL_COMMUNITY): Payer: Self-pay | Admitting: *Deleted

## 2020-06-01 ENCOUNTER — Encounter (HOSPITAL_COMMUNITY)
Admission: RE | Disposition: A | Discharge status: Home / Self Care | Payer: Self-pay | Source: Home / Self Care | Attending: Internal Medicine

## 2020-06-01 DIAGNOSIS — K648 Other hemorrhoids: Secondary | ICD-10-CM | POA: Insufficient documentation

## 2020-06-01 DIAGNOSIS — I252 Old myocardial infarction: Secondary | ICD-10-CM | POA: Insufficient documentation

## 2020-06-01 DIAGNOSIS — Z79899 Other long term (current) drug therapy: Secondary | ICD-10-CM | POA: Diagnosis not present

## 2020-06-01 DIAGNOSIS — Z87891 Personal history of nicotine dependence: Secondary | ICD-10-CM | POA: Insufficient documentation

## 2020-06-01 DIAGNOSIS — Z1211 Encounter for screening for malignant neoplasm of colon: Secondary | ICD-10-CM | POA: Insufficient documentation

## 2020-06-01 DIAGNOSIS — I10 Essential (primary) hypertension: Secondary | ICD-10-CM | POA: Insufficient documentation

## 2020-06-01 DIAGNOSIS — I428 Other cardiomyopathies: Secondary | ICD-10-CM | POA: Diagnosis not present

## 2020-06-01 DIAGNOSIS — K219 Gastro-esophageal reflux disease without esophagitis: Secondary | ICD-10-CM | POA: Insufficient documentation

## 2020-06-01 DIAGNOSIS — K5790 Diverticulosis of intestine, part unspecified, without perforation or abscess without bleeding: Secondary | ICD-10-CM | POA: Insufficient documentation

## 2020-06-01 DIAGNOSIS — Z7982 Long term (current) use of aspirin: Secondary | ICD-10-CM | POA: Insufficient documentation

## 2020-06-01 HISTORY — PX: COLONOSCOPY WITH PROPOFOL: SHX5780

## 2020-06-01 SURGERY — COLONOSCOPY WITH PROPOFOL
Anesthesia: General

## 2020-06-01 MED ORDER — LACTATED RINGERS IV SOLN: INTRAVENOUS | Status: DC | PRN

## 2020-06-01 MED ORDER — CHLORHEXIDINE GLUCONATE CLOTH 2 % EX PADS: 6.0000 | MEDICATED_PAD | Freq: Once | CUTANEOUS | Status: DC

## 2020-06-01 MED ORDER — STERILE WATER FOR IRRIGATION IR SOLN
Status: DC | PRN
  Administered 2020-06-01: 1.5 mL

## 2020-06-01 MED ORDER — LACTATED RINGERS IV SOLN: Freq: Once | INTRAVENOUS | Status: AC

## 2020-06-01 MED ORDER — PROPOFOL 10 MG/ML IV BOLUS
INTRAVENOUS | Status: DC | PRN
  Administered 2020-06-01: 100 mg via INTRAVENOUS
  Administered 2020-06-01 (×3): 50 mg via INTRAVENOUS
  Administered 2020-06-01: 30 mg via INTRAVENOUS

## 2020-06-01 MED ORDER — LIDOCAINE 2% (20 MG/ML) 5 ML SYRINGE
INTRAMUSCULAR | Status: DC | PRN
  Administered 2020-06-01: 100 mg via INTRAVENOUS

## 2020-06-01 NOTE — Transfer of Care (Signed)
Immediate Anesthesia Transfer of Care Note  Patient: Gregory Parsons  Procedure(s) Performed: COLONOSCOPY WITH PROPOFOL (N/A )  Patient Location: Endoscopy Unit  Anesthesia Type:General  Level of Consciousness: awake and patient cooperative  Airway & Oxygen Therapy: Patient Spontanous Breathing  Post-op Assessment: Report given to RN, Post -op Vital signs reviewed and stable and Patient moving all extremities X 4  Post vital signs: Reviewed and stable  Last Vitals:  Vitals Value Taken Time  BP 110/72 06/01/20 1052  Temp 36.8 C 06/01/20 1052  Pulse 95 06/01/20 1052  Resp 24 06/01/20 1052  SpO2 97 % 06/01/20 1052    Last Pain:  Vitals:   06/01/20 1052  TempSrc: Oral  PainSc: 0-No pain      Patients Stated Pain Goal: 0 (06/01/20 1006)  Complications: No complications documented.

## 2020-06-01 NOTE — Anesthesia Preprocedure Evaluation (Addendum)
Anesthesia Evaluation  Patient identified by MRN, date of birth, ID band Patient awake    Reviewed: Allergy & Precautions, NPO status , Patient's Chart, lab work & pertinent test results, reviewed documented beta blocker date and time   History of Anesthesia Complications Negative for: history of anesthetic complications  Airway Mallampati: II  TM Distance: >3 FB Neck ROM: Full    Dental no notable dental hx. (+) Dental Advisory Given, Teeth Intact   Pulmonary neg pulmonary ROS, former smoker,    Pulmonary exam normal breath sounds clear to auscultation       Cardiovascular Exercise Tolerance: Good hypertension, Pt. on medications and Pt. on home beta blockers + Past MI  Normal cardiovascular exam Rhythm:Regular Rate:Normal - Systolic murmurs, - Diastolic murmurs, - Friction Rub, - Carotid Bruit, - Peripheral Edema and - Systolic Click 1. No angiographic evidence of CAD 2. Normal LV systolic function 3. Non-cardiac chest pain     Neuro/Psych negative psych ROS   GI/Hepatic Neg liver ROS, GERD  Medicated and Controlled,  Endo/Other  negative endocrine ROS  Renal/GU negative Renal ROS     Musculoskeletal  (+) Arthritis , Osteoarthritis,    Abdominal   Peds  Hematology negative hematology ROS (+)   Anesthesia Other Findings Snoring   Reproductive/Obstetrics negative OB ROS                           Anesthesia Physical Anesthesia Plan  ASA: II  Anesthesia Plan: General   Post-op Pain Management:    Induction: Intravenous  PONV Risk Score and Plan: TIVA  Airway Management Planned: Nasal Cannula and Natural Airway  Additional Equipment:   Intra-op Plan:   Post-operative Plan:   Informed Consent: I have reviewed the patients History and Physical, chart, labs and discussed the procedure including the risks, benefits and alternatives for the proposed anesthesia with the patient  or authorized representative who has indicated his/her understanding and acceptance.     Dental advisory given  Plan Discussed with: CRNA and Surgeon  Anesthesia Plan Comments:         Anesthesia Quick Evaluation

## 2020-06-01 NOTE — Op Note (Signed)
[facility] Patient Name: Gregory Parsons Procedure Date: 06/01/2020 10:11 AM MRN: [id number] Date of Birth: 09-26-1965 Attending MD: Elon Alas. Abbey Chatters DO CSN: [id number] Age: 54 Admit Type: Inpatient Procedure:                Colonoscopy Indications:              Screening for colorectal malignant neoplasm Providers:                Elon Alas. Abbey Chatters, DO, Caprice Kluver, Crystal Page,                            Casimer Bilis, Technician, Aram Candela Referring MD:              Medicines:                Monitored Anesthesia Care Complications:            No immediate complications. Estimated Blood Loss:     Estimated blood loss: none. Procedure:                Pre-Anesthesia Assessment:                           - The anesthesia plan was to use monitored                            anesthesia care (MAC).                           After obtaining informed consent, the colonoscope                            was passed under direct vision. Throughout the                            procedure, the patient's blood pressure, pulse, and                            oxygen saturations were monitored continuously. The                            [id number]-[id number] ([id number]) was introduced through the                            anus and advanced to the the terminal ileum, with                            identification of the appendiceal orifice and IC                            valve. The colonoscopy was performed without                            difficulty. The patient tolerated the procedure                            well. The quality of  the bowel preparation was                            evaluated using the BBPS [facility] Bowel Preparation                            Scale) with scores of: Right Colon = 2 (minor                            amount of residual staining, small fragments of                            stool and/or opaque liquid, but mucosa seen well),                             Transverse Colon = 3 (entire mucosa seen well with                            no residual staining, small fragments of stool or                            opaque liquid) and Left Colon = 3 (entire mucosa                            seen well with no residual staining, small                            fragments of stool or opaque liquid). The total                            BBPS score equals 8. The quality of the bowel                            preparation was good. Scope In: 10:36:33 AM Scope Out: 10:49:38 AM Scope Withdrawal Time: 0 hours 11 minutes 26 seconds  Total Procedure Duration: 0 hours 13 minutes 5 seconds  Findings:      The perianal and digital rectal examinations were normal.      Non-bleeding internal hemorrhoids were found during endoscopy.      Many small and large-mouthed diverticula were found in the entire colon.      The terminal ileum appeared normal.      The exam was otherwise without abnormality. Impression:               - Non-bleeding internal hemorrhoids.                           - Diverticulosis in the entire examined colon.                           - The examined portion of the ileum was normal.                           - The examination was otherwise normal.                           -  No specimens collected. Moderate Sedation:      Per Anesthesia Care Recommendation:           - Patient has a contact number available for                            emergencies. The signs and symptoms of potential                            delayed complications were discussed with the                            patient. Return to normal activities tomorrow.                            Written discharge instructions were provided to the                            patient.                           - Resume previous diet.                           - Continue present medications.                           - Repeat colonoscopy in 10 years for screening                             purposes.                           - Return to GI clinic as previously scheduled. Procedure Code(s):        --- Professional ---                           Z0258, Colorectal cancer screening; colonoscopy on                            individual not meeting criteria for high risk Diagnosis Code(s):        --- Professional ---                           Z12.11, Encounter for screening for malignant                            neoplasm of colon                           K64.8, Other hemorrhoids                           K57.30, Diverticulosis of large intestine without                            perforation or abscess without bleeding CPT copyright 2019 American  Medical Association. All rights reserved. The codes documented in this report are preliminary and upon coder review may  be revised to meet current compliance requirements. Elon Alas. Abbey Chatters, [place] Abbey Chatters, DO 06/01/2020 10:53:45 AM This report has been signed electronically. Number of Addenda: 0

## 2020-06-01 NOTE — OR Nursing (Signed)
Gregory Parsons had a procedure [facility] on 06/01/20 and cannot return to work until 2:00pm on 06/02/20.

## 2020-06-01 NOTE — Discharge Instructions (Addendum)
Colonoscopy Discharge Instructions  Read the instructions outlined below and refer to this sheet in the next few weeks. These discharge instructions provide you with general information on caring for yourself after you leave the hospital. Your doctor may also give you specific instructions. While your treatment has been planned according to the most current medical practices available, unavoidable complications occasionally occur.   ACTIVITY  You may resume your regular activity, but move at a slower pace for the next 24 hours.   Take frequent rest periods for the next 24 hours.   Walking will help get rid of the air and reduce the bloated feeling in your belly (abdomen).   No driving for 24 hours (because of the medicine (anesthesia) used during the test).    Do not sign any important legal documents or operate any machinery for 24 hours (because of the anesthesia used during the test).  NUTRITION  Drink plenty of fluids.   You may resume your normal diet as instructed by your doctor.   Begin with a light meal and progress to your normal diet. Heavy or fried foods are harder to digest and may make you feel sick to your stomach (nauseated).   Avoid alcoholic beverages for 24 hours or as instructed.  MEDICATIONS  You may resume your normal medications unless your doctor tells you otherwise.  WHAT YOU CAN EXPECT TODAY  Some feelings of bloating in the abdomen.   Passage of more gas than usual.   Spotting of blood in your stool or on the toilet paper.  IF YOU HAD POLYPS REMOVED DURING THE COLONOSCOPY:  No aspirin products for 7 days or as instructed.   No alcohol for 7 days or as instructed.   Eat a soft diet for the next 24 hours.  FINDING OUT THE RESULTS OF YOUR TEST Not all test results are available during your visit. If your test results are not back during the visit, make an appointment with your caregiver to find out the results. Do not assume everything is normal if  you have not heard from your caregiver or the medical facility. It is important for you to follow up on all of your test results.  SEEK IMMEDIATE MEDICAL ATTENTION IF:  You have more than a spotting of blood in your stool.   Your belly is swollen (abdominal distention).   You are nauseated or vomiting.   You have a temperature over 101.   You have abdominal pain or discomfort that is severe or gets worse throughout the day.   Your colonoscopy was relatively unremarkable.  I did not find any polyps.  You do have diverticulosis throughout the entire colon as well as internal hemorrhoids.  I recommend increasing fiber in your diet or adding over-the-counter Benefiber or Metamucil.  I recommend we repeat colonoscopy in 10 years for screening purposes.  Follow-up with GI as previously scheduled.  I hope you have a great rest of your week!  Hennie Duos. Marletta Lor, D.O. Gastroenterology and Hepatology [facility]   Hemorrhoids Hemorrhoids are swollen veins in and around the rectum or anus. There are two types of hemorrhoids: Internal hemorrhoids. These occur in the veins that are just inside the rectum. They may poke through to the outside and become irritated and painful. External hemorrhoids. These occur in the veins that are outside the anus and can be felt as a painful swelling or hard lump near the anus. Most hemorrhoids do not cause serious problems, and they can be  managed with home treatments such as diet and lifestyle changes. If home treatments do not help the symptoms, procedures can be done to shrink or remove the hemorrhoids. What are the causes? This condition is caused by increased pressure in the anal area. This pressure may result from various things, including: Constipation. Straining to have a bowel movement. Diarrhea. Pregnancy. Obesity. Sitting for long periods of time. Heavy lifting or other activity that causes you to strain. Anal sex. Riding  a bike for a long period of time. What are the signs or symptoms? Symptoms of this condition include: Pain. Anal itching or irritation. Rectal bleeding. Leakage of stool (feces). Anal swelling. One or more lumps around the anus. How is this diagnosed? This condition can often be diagnosed through a visual exam. Other exams or tests may also be done, such as: An exam that involves feeling the rectal area with a gloved hand (digital rectal exam). An exam of the anal canal that is done using a small tube (anoscope). A blood test, if you have lost a significant amount of blood. A test to look inside the colon using a flexible tube with a camera on the end (sigmoidoscopy or colonoscopy). How is this treated? This condition can usually be treated at home. However, various procedures may be done if dietary changes, lifestyle changes, and other home treatments do not help your symptoms. These procedures can help make the hemorrhoids smaller or remove them completely. Some of these procedures involve surgery, and others do not. Common procedures include: Rubber band ligation. Rubber bands are placed at the base of the hemorrhoids to cut off their blood supply. Sclerotherapy. Medicine is injected into the hemorrhoids to shrink them. Infrared coagulation. A type of light energy is used to get rid of the hemorrhoids. Hemorrhoidectomy surgery. The hemorrhoids are surgically removed, and the veins that supply them are tied off. Stapled hemorrhoidopexy surgery. The surgeon staples the base of the hemorrhoid to the rectal wall. Follow these instructions at home: Eating and drinking  Eat foods that have a lot of fiber in them, such as whole grains, beans, nuts, fruits, and vegetables. Ask your health care provider about taking products that have added fiber (fiber supplements). Reduce the amount of fat in your diet. You can do this by eating low-fat dairy products, eating less red meat, and avoiding  processed foods. Drink enough fluid to keep your urine pale yellow. Managing pain and swelling  Take warm sitz baths for 20 minutes, 3-4 times a day to ease pain and discomfort. You may do this in a bathtub or using a portable sitz bath that fits over the toilet. If directed, apply ice to the affected area. Using ice packs between sitz baths may be helpful. Put ice in a plastic bag. Place a towel between your skin and the bag. Leave the ice on for 20 minutes, 2-3 times a day. General instructions Take over-the-counter and prescription medicines only as told by your health care provider. Use medicated creams or suppositories as told. Get regular exercise. Ask your health care provider how much and what kind of exercise is best for you. In general, you should do moderate exercise for at least 30 minutes on most days of the week (150 minutes each week). This can include activities such as walking, biking, or yoga. Go to the bathroom when you have the urge to have a bowel movement. Do not wait. Avoid straining to have bowel movements. Keep the anal area dry and clean. Use  wet toilet paper or moist towelettes after a bowel movement. Do not sit on the toilet for long periods of time. This increases blood pooling and pain. Keep all follow-up visits as told by your health care provider. This is important. Contact a health care provider if you have: Increasing pain and swelling that are not controlled by treatment or medicine. Difficulty having a bowel movement, or you are unable to have a bowel movement. Pain or inflammation outside the area of the hemorrhoids. Get help right away if you have: Uncontrolled bleeding from your rectum. Summary Hemorrhoids are swollen veins in and around the rectum or anus. Most hemorrhoids can be managed with home treatments such as diet and lifestyle changes. Taking warm sitz baths can help ease pain and discomfort. In severe cases, procedures or surgery can be  done to shrink or remove the hemorrhoids. This information is not intended to replace advice given to you by your health care provider. Make sure you discuss any questions you have with your health care provider. Document Revised: 01/25/2019 Document Reviewed: 01/18/2018 Elsevier Patient Education  2020 [employer].  Diverticulosis  Diverticulosis is a condition that develops when small pouches (diverticula) form in the wall of the large intestine (colon). The colon is where water is absorbed and stool (feces) is formed. The pouches form when the inside layer of the colon pushes through weak spots in the outer layers of the colon. You may have a few pouches or many of them. The pouches usually do not cause problems unless they become inflamed or infected. When this happens, the condition is called diverticulitis. What are the causes? The cause of this condition is not known. What increases the risk? The following factors may make you more likely to develop this condition:  Being older than age 55. Your risk for this condition increases with age. Diverticulosis is rare among people younger than age 85. By age 74, many people have it.  Eating a low-fiber diet.  Having frequent constipation.  Being overweight.  Not getting enough exercise.  Smoking.  Taking over-the-counter pain medicines, like aspirin and ibuprofen.  Having a family history of diverticulosis. What are the signs or symptoms? In most people, there are no symptoms of this condition. If you do have symptoms, they may include:  Bloating.  Cramps in the abdomen.  Constipation or diarrhea.  Pain in the lower left side of the abdomen. How is this diagnosed? Because diverticulosis usually has no symptoms, it is most often diagnosed during an exam for other colon problems. The condition may be diagnosed by:  Using a flexible scope to examine the colon (colonoscopy).  Taking an X-ray of the colon after dye has been  put into the colon (barium enema).  Having a CT scan. How is this treated? You may not need treatment for this condition. Your health care provider may recommend treatment to prevent problems. You may need treatment if you have symptoms or if you previously had diverticulitis. Treatment may include:  Eating a high-fiber diet.  Taking a fiber supplement.  Taking a live bacteria supplement (probiotic).  Taking medicine to relax your colon. Follow these instructions at home: Medicines  Take over-the-counter and prescription medicines only as told by your health care provider.  If told by your health care provider, take a fiber supplement or probiotic. Constipation prevention Your condition may cause constipation. To prevent or treat constipation, you may need to:  Drink enough fluid to keep your urine pale yellow.  Take  over-the-counter or prescription medicines.  Eat foods that are high in fiber, such as beans, whole grains, and fresh fruits and vegetables.  Limit foods that are high in fat and processed sugars, such as fried or sweet foods.  General instructions  Try not to strain when you have a bowel movement.  Keep all follow-up visits as told by your health care provider. This is important. Contact a health care provider if you:  Have pain in your abdomen.  Have bloating.  Have cramps.  Have not had a bowel movement in 3 days. Get help right away if:  Your pain gets worse.  Your bloating becomes very bad.  You have a fever or chills, and your symptoms suddenly get worse.  You vomit.  You have bowel movements that are bloody or black.  You have bleeding from your rectum. Summary  Diverticulosis is a condition that develops when small pouches (diverticula) form in the wall of the large intestine (colon).  You may have a few pouches or many of them.  This condition is most often diagnosed during an exam for other colon problems.  Treatment may include  increasing the fiber in your diet, taking supplements, or taking medicines. This information is not intended to replace advice given to you by your health care provider. Make sure you discuss any questions you have with your health care provider. Document Revised: 03/28/2019 Document Reviewed: 03/28/2019 Elsevier Patient Education  2020 [employer].

## 2020-06-01 NOTE — Anesthesia Postprocedure Evaluation (Signed)
Anesthesia Post Note  Patient: Gregory Parsons  Procedure(s) Performed: COLONOSCOPY WITH PROPOFOL (N/A )  Patient location during evaluation: Endoscopy Anesthesia Type: General Level of consciousness: awake Pain management: pain level controlled Vital Signs Assessment: post-procedure vital signs reviewed and stable Respiratory status: spontaneous breathing Postop Assessment: no headache and no apparent nausea or vomiting Anesthetic complications: no   No complications documented.   Last Vitals:  Vitals:   06/01/20 1006 06/01/20 1052  BP: (!) 155/88 110/72  Pulse: 98 95  Resp: 19 (!) 24  Temp: 36.7 C 36.8 C  SpO2: 97% 97%    Last Pain:  Vitals:   06/01/20 1052  TempSrc: Oral  PainSc: 0-No pain                 Julian Reil

## 2020-06-01 NOTE — Interval H&P Note (Signed)
History and Physical Interval Note:  06/01/2020 10:17 AM  Gregory Parsons  has presented today for surgery, with the diagnosis of screening colonoscopy.  The various methods of treatment have been discussed with the patient and family. After consideration of risks, benefits and other options for treatment, the patient has consented to  Procedure(s) with comments: COLONOSCOPY WITH PROPOFOL (N/A) - 12:00pm as a surgical intervention.  The patient's history has been reviewed, patient examined, no change in status, stable for surgery.  I have reviewed the patient's chart and labs.  Questions were answered to the patient's satisfaction.     Lanelle Bal

## 2020-06-04 ENCOUNTER — Encounter (HOSPITAL_COMMUNITY): Payer: Self-pay | Admitting: Internal Medicine

## 2020-10-26 ENCOUNTER — Encounter: Payer: Self-pay | Admitting: Internal Medicine
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
## 2020-11-16 ENCOUNTER — Encounter: Payer: Self-pay | Admitting: Internal Medicine

## 2020-11-16 ENCOUNTER — Ambulatory Visit: Payer: BC Managed Care – PPO | Admitting: Gastroenterology

## 2020-11-18 ENCOUNTER — Institutional Professional Consult (permissible substitution): Payer: BC Managed Care – PPO | Admitting: Internal Medicine

## 2021-01-25 ENCOUNTER — Other Ambulatory Visit: Payer: Self-pay

## 2021-01-25 ENCOUNTER — Ambulatory Visit (HOSPITAL_COMMUNITY)
Admission: RE | Admit: 2021-01-25 | Discharge: 2021-01-25 | Disposition: A | Discharge status: Home / Self Care | Payer: BC Managed Care – PPO | Source: Ambulatory Visit | Attending: Internal Medicine | Admitting: Internal Medicine

## 2021-01-25 ENCOUNTER — Encounter: Payer: Self-pay | Admitting: Internal Medicine

## 2021-01-25 ENCOUNTER — Ambulatory Visit (INDEPENDENT_AMBULATORY_CARE_PROVIDER_SITE_OTHER): Payer: BC Managed Care – PPO | Admitting: Internal Medicine

## 2021-01-25 DIAGNOSIS — J1282 Pneumonia due to coronavirus disease 2019: Secondary | ICD-10-CM

## 2021-01-25 DIAGNOSIS — U071 COVID-19: Secondary | ICD-10-CM | POA: Diagnosis not present

## 2021-01-25 NOTE — Progress Notes (Signed)
Gregory Parsons, male    DOB: 05-03-1966,   MRN: [id number]   Brief patient profile:  54 yobm quit smoking 2008 and fine until developing sob assoc with gerd 2021 then covid end of 2021 >  No specific rx and able to back to work March 15th 2022 and gradually improving since then referred to pulmonary clinic in [place]  01/25/2021 by Dr  Dian Situ office as missed prior appt     History of Present Illness  01/25/2021  Pulmonary/ 1st office eval/ Estha Few / [facility]  Chief Complaint  Patient presents with  . Pulmonary Consult    Referred by Fara Chute, PA. Pt states had covid 19 end of 2021- has been slowly improving DOE  since then.   Dyspnea:  Gradually improved since back to work in march 2022 and now  Denies  limited by breathing from desired activities  / steps even ok unless "does a bunch"  Cough: none  Sleep: bed flat/ 2 pillows under head  SABA use: none   No obvious day to day or daytime variability or assoc excess/ purulent sputum or mucus plugs or hemoptysis or cp or chest tightness, subjective wheeze or overt sinus or hb symptoms.   sleepkng as above without nocturnal  or early am exacerbation  of respiratory  c/o's or need for noct saba. Also denies any obvious fluctuation of symptoms with weather or environmental changes or other aggravating or alleviating factors except as outlined above   No unusual exposure hx or h/o childhood pna/ asthma or knowledge of premature birth.  Current Allergies, Complete Past Medical History, Past Surgical History, Family History, and Social History were reviewed in [employer] record.  ROS  The following are not active complaints unless bolded Hoarseness, sore throat, dysphagia, dental problems, itching, sneezing,  nasal congestion or discharge of excess mucus or purulent secretions, ear ache,   fever, chills, sweats, unintended wt loss or wt gain, classically pleuritic or exertional cp,  orthopnea pnd or  arm/hand swelling  or leg swelling, presyncope, palpitations, abdominal pain, anorexia, nausea, vomiting, diarrhea  or change in bowel habits or change in bladder habits, change in stools or change in urine, dysuria, hematuria,  rash, arthralgias, visual complaints, headache, numbness, weakness or ataxia or problems with walking or coordination,  change in mood or  memory.           Past Medical History:  Diagnosis Date  . Acid reflux   . Arthritis    "left knee, right hand" (09/13/2017)  . History of stomach ulcers    "bleeds when I eat spicy foods" (09/13/2017)  . Hypertension   . NICM (nonischemic cardiomyopathy) (HCC)   . STEMI (ST elevation myocardial infarction) (HCC) 09/13/2017   [name] 09/13/2017    Outpatient Medications Prior to Visit  Medication Sig Dispense Refill  . aspirin 81 MG EC tablet Take 81 mg by mouth every other day.     . carvedilol (COREG) 3.125 MG tablet Take 3.125 mg by mouth 2 (two) times daily with a meal.    . cetirizine (ZYRTEC) 10 MG tablet Take 10 mg by mouth daily.    . fluticasone (FLONASE) 50 MCG/ACT nasal spray Place 1 spray into both nostrils 2 (two) times daily. (Patient taking differently: Place 1 spray into both nostrils daily.) 11.1 mL 2  . losartan (COZAAR) 25 MG tablet Take 25 mg by mouth daily.    . pantoprazole (PROTONIX) 40 MG tablet Take 40 mg by mouth  2 (two) times daily.    . cefPROZIL (CEFZIL) 500 MG tablet Take 500 mg by mouth 2 (two) times daily.     No facility-administered medications prior to visit.     Objective:     BP (!) 160/90 (BP Location: Left Arm, Cuff Size: Normal)   Pulse 100   Temp 97.8 F (36.6 C) (Temporal)   Ht 5\' 4"  (1.626 m)   Wt 212 lb (96.2 kg)   SpO2 100% Comment: on RA  BMI 36.39 kg/m   SpO2: 100 % (on RA)    HEENT : pt wearing mask not removed for exam due to covid -19 concerns.    NECK :  without JVD/Nodes/TM/ nl carotid upstrokes bilaterally   LUNGS: no acc muscle use,  Nl contour chest which  is clear to A and P bilaterally without cough on insp or exp maneuvers   CV:  RRR  no s3 or murmur or increase in P2, and no edema   ABD:  soft and nontender with nl inspiratory excursion in the supine position. No bruits or organomegaly appreciated, bowel sounds nl  MS:  Nl gait/ ext warm without deformities, calf tenderness, cyanosis or clubbing No obvious joint restrictions   SKIN: warm and dry without lesions    NEURO:  alert, approp, nl sensorium with  no motor or cerebellar deficits apparent.     CXR PA and Lateral:   01/25/2021 :    I personally reviewed images and agree with radiology impression as follows:    Negative for acute cardiopulmonary disease     I personally reviewed images and agree with radiology impression as follows:  CXR:   09/16/20 at [facility] hospital Minimal streaky lower lung opacities may reflect atelectasis or  small foci of pneumonia.       Assessment   Pneumonia due to COVID-19 virus Onset of symptoms end of Dec 2021 rx at [facility] > no specific rx > returned to work 11/24/20 -  01/25/2021   Walked RA  approx   500 ft  @ fast pace  stopped due to  End of study, no sob, sats still 97%    Reassured no permanent lung injury from COVID 19 and main concern is that he is still unvaccinated against future strains which may more virulent than the one he survived   Advised on benefit of "hybrid immunity" available by taking either of the RNA vaccines available at his local drugstore  Pulmonary f/u is prn   Each maintenance medication was reviewed in detail including emphasizing most importantly the difference between maintenance and prns and under what circumstances the prns are to be triggered using an action plan format where appropriate.  Total time for H and P, chart review, counseling,  directly observing portions of ambulatory 02 saturation study/ and generating customized AVS unique to this office visit / same day charting = 01/27/2021                    , MD 01/25/2021

## 2021-01-25 NOTE — Assessment & Plan Note (Signed)
Onset of symptoms end of Dec 2021 rx at [facility] > no specific rx > returned to work 11/24/20 -  01/25/2021   Walked RA  approx   500 ft  @ fast pace  stopped due to  End of study, no sob, sats still 97%    Reassured no permanent lung injury from COVID 19 and main concern is that he is still unvaccinated against future strains which may more virulent than the one he survived   Advised on benefit of "hybrid immunity" available by taking either of the RNA vaccines available at his local drugstore  Pulmonary f/u is prn   Each maintenance medication was reviewed in detail including emphasizing most importantly the difference between maintenance and prns and under what circumstances the prns are to be triggered using an action plan format where appropriate.  Total time for H and P, chart review, counseling,  directly observing portions of ambulatory 02 saturation study/ and generating customized AVS unique to this office visit / same day charting = 

## 2021-01-25 NOTE — Patient Instructions (Addendum)
Protonix  Take 30- 60 min before your first and last meals of the day   GERD (REFLUX)  is an extremely common cause of respiratory symptoms just like yours,  many times with no obvious heartburn at all.    It can be treated with medication, but also with lifestyle changes including elevation of the head of your bed (ideally with 6 -8inch blocks under the headboard of your bed),  Smoking cessation, avoidance of late meals, excessive alcohol, and avoid fatty foods, chocolate, peppermint, colas, red wine, and acidic juices such as orange juice.  NO MINT OR MENTHOL PRODUCTS SO NO COUGH DROPS  USE SUGARLESS CANDY INSTEAD (Jolley ranchers or Stover's or Life Savers) or even ice chips will also do - the key is to swallow to prevent all throat clearing. NO OIL BASED VITAMINS - use powdered substitutes.  Avoid fish oil when coughing.     I very strongly recommend you get the moderna or pfizer vaccine as soon as possible based on your risk of dying from the virus  and the proven safety and benefit of these vaccines against even the delta and omicron variants.  This can save your life as well as  those of your loved ones,  especially if they are also not vaccinated.    Please remember to go to the  x-ray department  @  [facility] for your tests - we will call you with the results when they are available      Pulmonary follow up is needed

## 2021-01-26 ENCOUNTER — Encounter: Payer: Self-pay | Admitting: *Deleted
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
## 2021-09-19 ENCOUNTER — Encounter: Payer: Self-pay | Admitting: Gastroenterology

## 2021-09-20 ENCOUNTER — Ambulatory Visit: Payer: BC Managed Care – PPO | Admitting: Gastroenterology

## 2021-09-20 ENCOUNTER — Encounter: Payer: Self-pay | Admitting: Internal Medicine

## 2021-10-07 ENCOUNTER — Encounter: Payer: Self-pay | Admitting: Internal Medicine
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
## 2022-01-31 ENCOUNTER — Ambulatory Visit: Payer: BC Managed Care – PPO | Admitting: Gastroenterology

## 2022-11-08 ENCOUNTER — Encounter (HOSPITAL_COMMUNITY): Payer: Self-pay

## 2022-11-08 ENCOUNTER — Emergency Department (HOSPITAL_COMMUNITY): Payer: BC Managed Care – PPO

## 2022-11-08 ENCOUNTER — Other Ambulatory Visit: Payer: Self-pay

## 2022-11-08 ENCOUNTER — Emergency Department (HOSPITAL_COMMUNITY)
Admission: EM | Admit: 2022-11-08 | Discharge: 2022-11-08 | Disposition: A | Discharge status: Home / Self Care | Payer: BC Managed Care – PPO | Attending: Emergency Medicine | Admitting: Emergency Medicine

## 2022-11-08 DIAGNOSIS — I2510 Atherosclerotic heart disease of native coronary artery without angina pectoris: Secondary | ICD-10-CM | POA: Insufficient documentation

## 2022-11-08 DIAGNOSIS — E876 Hypokalemia: Secondary | ICD-10-CM | POA: Diagnosis not present

## 2022-11-08 DIAGNOSIS — I10 Essential (primary) hypertension: Secondary | ICD-10-CM | POA: Insufficient documentation

## 2022-11-08 DIAGNOSIS — Z7982 Long term (current) use of aspirin: Secondary | ICD-10-CM | POA: Diagnosis not present

## 2022-11-08 DIAGNOSIS — R1030 Lower abdominal pain, unspecified: Secondary | ICD-10-CM | POA: Diagnosis present

## 2022-11-08 DIAGNOSIS — Z79899 Other long term (current) drug therapy: Secondary | ICD-10-CM | POA: Diagnosis not present

## 2022-11-08 LAB — COMPREHENSIVE METABOLIC PANEL
ALT: 24 U/L (ref 0–44)
AST: 20 U/L (ref 15–41)
Albumin: 4.1 g/dL (ref 3.5–5.0)
Alkaline Phosphatase: 65 U/L (ref 38–126)
Anion gap: 9 (ref 5–15)
BUN: 18 mg/dL (ref 6–20)
CO2: 23 mmol/L (ref 22–32)
Calcium: 8.7 mg/dL — ABNORMAL LOW (ref 8.9–10.3)
Chloride: 104 mmol/L (ref 98–111)
Creatinine, Ser: 1.16 mg/dL (ref 0.61–1.24)
GFR, Estimated: >60 mL/min (ref >60)
Glucose, Bld: 136 mg/dL — ABNORMAL HIGH (ref 70–99)
Potassium: 3.4 mmol/L — ABNORMAL LOW (ref 3.5–5.1)
Sodium: 136 mmol/L (ref 135–145)
Total Bilirubin: 0.9 mg/dL (ref 0.3–1.2)
Total Protein: 7.7 g/dL (ref 6.5–8.1)

## 2022-11-08 LAB — URINALYSIS, ROUTINE W REFLEX MICROSCOPIC
Bacteria, UA: NONE SEEN
Bilirubin Urine: NEGATIVE
Glucose, UA: NEGATIVE mg/dL
Hgb urine dipstick: NEGATIVE
Ketones, ur: NEGATIVE mg/dL
Leukocytes,Ua: NEGATIVE
Nitrite: NEGATIVE
Protein, ur: 30 mg/dL — AB
Specific Gravity, Urine: 1.021 (ref 1.005–1.030)
pH: 5 (ref 5.0–8.0)

## 2022-11-08 LAB — CBC WITH DIFFERENTIAL/PLATELET
Abs Immature Granulocytes: 0.02 10*3/uL (ref 0.00–0.07)
Basophils Absolute: 0 10*3/uL (ref 0.0–0.1)
Basophils Relative: 1 %
Eosinophils Absolute: 0.1 10*3/uL (ref 0.0–0.5)
Eosinophils Relative: 1 %
HCT: 42.5 % (ref 39.0–52.0)
Hemoglobin: 14.9 g/dL (ref 13.0–17.0)
Immature Granulocytes: 1 %
Lymphocytes Relative: 38 %
Lymphs Abs: 1.4 10*3/uL (ref 0.7–4.0)
MCH: 29.7 pg (ref 26.0–34.0)
MCHC: 35.1 g/dL (ref 30.0–36.0)
MCV: 84.7 fL (ref 80.0–100.0)
Monocytes Absolute: 0.3 10*3/uL (ref 0.1–1.0)
Monocytes Relative: 7 %
Neutro Abs: 1.9 10*3/uL (ref 1.7–7.7)
Neutrophils Relative %: 52 %
Platelets: 247 10*3/uL (ref 150–400)
RBC: 5.02 MIL/uL (ref 4.22–5.81)
RDW: 12.7 % (ref 11.5–15.5)
WBC: 3.6 10*3/uL — ABNORMAL LOW (ref 4.0–10.5)
nRBC: 0 % (ref 0.0–0.2)

## 2022-11-08 LAB — MAGNESIUM: Magnesium: 2.1 mg/dL (ref 1.7–2.4)

## 2022-11-08 LAB — RAPID URINE DRUG SCREEN, HOSP PERFORMED
Amphetamines: NOT DETECTED
Barbiturates: NOT DETECTED
Benzodiazepines: NOT DETECTED
Cocaine: NOT DETECTED
Opiates: NOT DETECTED
Tetrahydrocannabinol: NOT DETECTED

## 2022-11-08 LAB — LIPASE, BLOOD: Lipase: 27 U/L (ref 11–51)

## 2022-11-08 LAB — TROPONIN I (HIGH SENSITIVITY): Troponin I (High Sensitivity): <2 ng/L

## 2022-11-08 LAB — PSA: Prostatic Specific Antigen: 9.92 ng/mL — ABNORMAL HIGH (ref 0.00–4.00)

## 2022-11-08 LAB — ETHANOL: Alcohol, Ethyl (B): <10 mg/dL

## 2022-11-08 MED ORDER — LACTATED RINGERS IV BOLUS
1000.0000 mL | Freq: Once | INTRAVENOUS | Status: AC
  Administered 2022-11-08: 1000 mL via INTRAVENOUS

## 2022-11-08 MED ORDER — IOHEXOL 350 MG/ML SOLN
100.0000 mL | Freq: Once | INTRAVENOUS | Status: AC | PRN
  Administered 2022-11-08: 100 mL via INTRAVENOUS

## 2022-11-08 NOTE — ED Triage Notes (Signed)
Complains of lower abd pain that started last week.  Reports it radiates around to his back.  Denies n/v/d.  Had BM this am which was normal.  Denies urinary symptoms.

## 2022-11-08 NOTE — Discharge Instructions (Signed)
On your CT scan, there was slight asymmetry of your prostate gland.  I do not feel that this is causing your abdominal pain.  It is something to be aware of.  A PSA (prostate specific antigen) level was collected in the ED but results will not be available till probably tomorrow.  You should follow-up with your primary care doctor or a urologist about PSA results and to see if you need any further testing.  There is a telephone number below that you can call for a urology follow-up.  Continue treating your reflux as needed.  Stay hydrated.  Minimize alcohol intake.  Return to the emergency department for any new or worsening symptoms of concern.

## 2022-11-08 NOTE — ED Notes (Signed)
ED Provider at bedside. 

## 2022-11-08 NOTE — ED Provider Notes (Signed)
[facility] Provider Note   CSN: [id number] Arrival date & time: 11/08/22  0825     History  Chief Complaint  Patient presents with   Abdominal Pain    Gregory Parsons is a 57 y.o. male.   Abdominal Pain Patient presents for lower abdominal pain.  Medical history includes CAD, arthritis, PUD, HTN.  Over the past week, patient has had intermittent, migrating areas of abdominal pain.  He states that they are typically in the lower aspect of his abdomen.  He has history of reflux and will occasionally have symptoms of reflux.  Lower abdominal pain is new for him.  At times, pain will radiate to flanks and back.  He states that he has had regular bowel movements, most recently of which was today.  He denies any associated nausea, fevers, chills.     Home Medications Prior to Admission medications   Medication Sig Start Date End Date Taking? Authorizing Provider  amLODipine (NORVASC) 5 MG tablet Take 5 mg by mouth daily. 04/11/21  Yes [provider]  aspirin 81 MG EC tablet Take 81 mg by mouth every other day.    Yes [provider]  carvedilol (COREG) 3.125 MG tablet Take 3.125 mg by mouth 2 (two) times daily with a meal.   Yes [provider]  losartan (COZAAR) 50 MG tablet Take 50 mg by mouth daily. 10/18/22 04/16/23 Yes [provider]  pantoprazole (PROTONIX) 40 MG tablet Take 40 mg by mouth 2 (two) times daily. 03/27/20  Yes [provider]      Allergies    Lisinopril    Review of Systems   Review of Systems  Gastrointestinal:  Positive for abdominal pain.  Genitourinary:  Positive for flank pain.  Musculoskeletal:  Positive for back pain.  All other systems reviewed and are negative.   Physical Exam Updated Vital Signs BP (!) 142/90   Pulse 75   Temp 97.9 F (36.6 C) (Oral)   Resp 20   Ht '5\' 4"'$  (1.626 m)   Wt 96.2 kg   SpO2 96%   BMI 36.39 kg/m  Physical Exam Vitals and  nursing note reviewed.  Constitutional:      General: He is not in acute distress.    Appearance: He is well-developed. He is not ill-appearing, toxic-appearing or diaphoretic.  HENT:     Head: Normocephalic and atraumatic.     Mouth/Throat:     Mouth: Mucous membranes are moist.  Eyes:     General: No scleral icterus.    Extraocular Movements: Extraocular movements intact.     Conjunctiva/sclera: Conjunctivae normal.  Cardiovascular:     Rate and Rhythm: Normal rate and regular rhythm.  Pulmonary:     Effort: Pulmonary effort is normal. No respiratory distress.  Abdominal:     Palpations: Abdomen is soft.     Tenderness: There is no abdominal tenderness.  Musculoskeletal:        General: No swelling.     Cervical back: Neck supple.  Skin:    General: Skin is warm and dry.     Capillary Refill: Capillary refill takes less than 2 seconds.     Coloration: Skin is not cyanotic or jaundiced.  Neurological:     General: No focal deficit present.     Mental Status: He is alert and oriented to person, place, and time.  Psychiatric:        Mood and Affect: Mood normal.  Behavior: Behavior normal.     ED Results / Procedures / Treatments   Labs (all labs ordered are listed, but only abnormal results are displayed) Labs Reviewed  COMPREHENSIVE METABOLIC PANEL - Abnormal; Notable for the following components:      Result Value   Potassium 3.4 (*)    Glucose, Bld 136 (*)    Calcium 8.7 (*)    All other components within normal limits  CBC WITH DIFFERENTIAL/PLATELET - Abnormal; Notable for the following components:   WBC 3.6 (*)    All other components within normal limits  URINALYSIS, ROUTINE W REFLEX MICROSCOPIC - Abnormal; Notable for the following components:   Protein, ur 30 (*)    All other components within normal limits  LIPASE, BLOOD  MAGNESIUM  RAPID URINE DRUG SCREEN, HOSP PERFORMED  ETHANOL  PSA  TROPONIN I (HIGH SENSITIVITY)    EKG EKG  Interpretation  Date/Time:  Tuesday November 08 2022 08:37:33 EST Ventricular Rate:  110 PR Interval:  169 QRS Duration: 96 QT Interval:  323 QTC Calculation: 437 R Axis:   74 Text Interpretation: Sinus tachycardia Borderline ST elevation, anterior leads Confirmed by Godfrey Pick [phone]) on 11/08/2022 9:49:53 AM  Radiology CT Angio Chest/Abd/Pel for Dissection W and/or Wo Contrast  Result Date: 11/08/2022 CLINICAL DATA:  Back pain and abdominal pain. Clinically suspected acute aortic syndrome. EXAM: CT ANGIOGRAPHY CHEST, ABDOMEN AND PELVIS TECHNIQUE: Initial noncontrast images were obtained through the chest. Multidetector CT imaging through the chest, abdomen and pelvis was performed using the standard protocol during bolus administration of intravenous contrast. Multiplanar reconstructed images and MIPs were obtained and reviewed to evaluate the vascular anatomy. RADIATION DOSE REDUCTION: This exam was performed according to the departmental dose-optimization program which includes automated exposure control, adjustment of the mA and/or kV according to patient size and/or use of iterative reconstruction technique. CONTRAST:  131m OMNIPAQUE IOHEXOL 350 MG/ML SOLN COMPARISON:  Multiple exams, including chest radiograph 10/19/2021 and CT abdomen 11/03/2017 FINDINGS: CTA CHEST FINDINGS Cardiovascular: Initial noncontrast images demonstrate no findings of acute intramural hematoma. No substantial atheromatous calcifications in the chest. No dissection or acute aortic findings. Branch vessels unremarkable. No large or central pulmonary emboli identified; today's exam was not optimized to assess the pulmonary arteries and accordingly has reduced sensitivity for peripheral emboli. Mediastinum/Nodes: Unremarkable Lungs/Pleura: 3 by 5 mm left lower lobe subpleural nodule along the major fissure on image 67 series 7, probably a subpleural lymph node. No follow-up needed if patient is low-risk.This recommendation  follows the consensus statement: Guidelines for Management of Incidental Pulmonary Nodules Detected on CT Images: From the Fleischner Society 2017; Radiology 2017; 284:228-243. Musculoskeletal: Degenerative glenohumeral arthropathy on the right. Review of the MIP images confirms the above findings. CTA ABDOMEN AND PELVIS FINDINGS VASCULAR Aorta: Unremarkable Celiac: Unremarkable. Conventional hepatic artery branching pattern. SMA: Unremarkable Renals: Unremarkable single bilateral renal arteries. IMA: Patent Inflow: Unremarkable Veins: Unremarkable for contrast phase. Review of the MIP images confirms the above findings. NON-VASCULAR Hepatobiliary: Unremarkable Pancreas: Unremarkable Spleen: Unremarkable Adrenals/Urinary Tract: Unremarkable Stomach/Bowel: Unremarkable.  Normal appendix. Lymphatic: Unremarkable Reproductive: Mild prostatomegaly with some asymmetric enhancement and contour along the right peripheral zone, nonspecific. Correlate with PSA levels in determining whether any further workup is indicated. Other: No supplemental non-categorized findings. Musculoskeletal: Suspected mild bilateral foraminal stenosis at L5-S1 due to disc bulge and facet arthropathy. Review of the MIP images confirms the above findings. IMPRESSION: 1. No acute vascular findings. No substantial degree of calcified atheromatous plaque. 2. Mild bilateral foraminal stenosis  at L5-S1 due to disc bulge and facet arthropathy. 3. Mild prostatomegaly with some asymmetric enhancement and contour along the right peripheral zone, nonspecific. Correlate with PSA levels in determining whether any further workup is indicated. 4. Degenerative glenohumeral arthropathy on the right. 5. 3 by 5 mm left lower lobe subpleural nodule along the major fissure, probably a subpleural lymph node. No follow-up needed if patient is low-risk. This recommendation follows the consensus statement: Guidelines for Management of Incidental Pulmonary Nodules Detected  on CT Images: From the Fleischner Society 2017; Radiology 2017; 284:228-243. Electronically Signed   By: Van Clines M.D.   On: 11/08/2022 10:34    Procedures Procedures    Medications Ordered in ED Medications  lactated ringers bolus 1,000 mL (1,000 mLs Intravenous New Bag/Given 11/08/22 0934)  iohexol (OMNIPAQUE) 350 MG/ML injection 100 mL (100 mLs Intravenous Contrast Given 11/08/22 1006)    ED Course/ Medical Decision Making/ A&P                             Medical Decision Making Amount and/or Complexity of Data Reviewed Labs: ordered. Radiology: ordered.  Risk Prescription drug management.   This patient presents to the ED for concern of abdominal pain, this involves an extensive number of treatment options, and is a complaint that carries with it a high risk of complications and morbidity.  The differential diagnosis includes constipation, IBS, colitis, UTI   Co morbidities that complicate the patient evaluation  CAD, arthritis, PUD, HTN, alcohol dependence   Additional history obtained:  Additional history obtained from N/A External records from outside source obtained and reviewed including EMR   Lab Tests:  I Ordered, and personally interpreted labs.  The pertinent results include: Normal hemoglobin, low-normal WBC, low additional potassium normal kidney function, normal hepatobiliary enzymes, no evidence of UTI   Imaging Studies ordered:  I ordered imaging studies including CTA dissection study I independently visualized and interpreted imaging which showed no acute findings.  There is incidental finding of asymmetric enhancement and contour of prostate and a small LLL subpleural nodule I agree with the radiologist interpretation   Cardiac Monitoring: / EKG:  The patient was maintained on a cardiac monitor.  I personally viewed and interpreted the cardiac monitored which showed an underlying rhythm of: Sinus rhythm  Problem List / ED Course /  Critical interventions / Medication management  Patient presents for intermittent pain over the past week.  He describes this as migrating areas of pain, typically in the lower abdomens and, at times, radiating to flanks and back.  Symptoms are currently resolved at this time.  His vital signs on arrival are notable for tachycardia, tachypnea, and hypertension.  He does state that he drinks beer daily.  Typical consumption is 6 beers per day.  Yesterday, he had 2 beers and feels like this may have worsened his symptoms.  Vital signs may be secondary to a alcohol withdrawal component.  CIWA score, however, is 0.  Will check lab work and CT imaging given his nonspecific description of recent symptoms.  Patient labwork is reassuring.  Even prior to IV fluids, patient had resolution of his tachycardia.  Hypertension improved as well.  Respiratory rate normalized.  Patient underwent CTA dissection study which did not show any acute findings.  There was incidental findings of a small LLL subpleural nodule in addition to some mild asymmetric enhancement contour of prostate.  PSA was sent.  Patient was advised to  follow-up with his primary care doctor or urologist for results of PSA and any further testing that may be indicated.  He did not have any recurrence of significant abdominal pain while in the ED.  He was discharged in stable condition. I ordered medication including IV fluids for hydration Reevaluation of the patient after these medicines showed that the patient improved I have reviewed the patients home medicines and have made adjustments as needed   Social Determinants of Health:  Has access to outpatient care         Final Clinical Impression(s) / ED Diagnoses Final diagnoses:  Lower abdominal pain    Rx / DC Orders ED Discharge Orders     None         Godfrey Pick, MD 11/08/22 1120

## 2022-11-08 NOTE — ED Notes (Signed)
Called lab to add trop on to labs just draw by phleb

## 2023-01-02 ENCOUNTER — Ambulatory Visit: Payer: BC Managed Care – PPO | Admitting: Urology

## 2023-02-22 ENCOUNTER — Ambulatory Visit: Payer: BC Managed Care – PPO | Admitting: Urology

## 2023-03-22 ENCOUNTER — Ambulatory Visit: Payer: BC Managed Care – PPO | Admitting: Urology
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
## 2023-11-07 ENCOUNTER — Ambulatory Visit: Payer: BC Managed Care – PPO | Admitting: Urology

## 2023-11-07 VITALS — BP 151/91 | HR 90

## 2023-11-07 DIAGNOSIS — R972 Elevated prostate specific antigen [PSA]: Secondary | ICD-10-CM

## 2023-11-07 DIAGNOSIS — N402 Nodular prostate without lower urinary tract symptoms: Secondary | ICD-10-CM | POA: Diagnosis not present

## 2023-11-07 LAB — URINALYSIS, ROUTINE W REFLEX MICROSCOPIC
Bilirubin, UA: NEGATIVE
Glucose, UA: NEGATIVE
Ketones, UA: NEGATIVE
Leukocytes,UA: NEGATIVE
Nitrite, UA: NEGATIVE
Protein,UA: NEGATIVE
RBC, UA: NEGATIVE
Specific Gravity, UA: 1.02 (ref 1.005–1.030)
Urobilinogen, Ur: 2 mg/dL — ABNORMAL HIGH (ref 0.2–1.0)
pH, UA: 7 (ref 5.0–7.5)

## 2023-11-08 LAB — PSA: Prostate Specific Ag, Serum: 15.6 ng/mL — ABNORMAL HIGH (ref 0.0–4.0)

## 2023-11-13 ENCOUNTER — Telehealth: Payer: Self-pay

## 2023-11-13 ENCOUNTER — Other Ambulatory Visit: Payer: Self-pay | Admitting: Urology

## 2023-11-13 DIAGNOSIS — R972 Elevated prostate specific antigen [PSA]: Secondary | ICD-10-CM

## 2023-11-13 MED ORDER — LEVOFLOXACIN 750 MG PO TABS: 750.0000 mg | ORAL_TABLET | Freq: Every day | ORAL | 0 refills | Status: AC

## 2023-11-13 NOTE — Telephone Encounter (Signed)
 Called Pt to relay message from MD per last telephone encounter lvm

## 2023-11-13 NOTE — Telephone Encounter (Signed)
-----   Message from Bertram Millard Dahlstedt sent at 11/13/2023  3:32 PM EST ----- Please call patient-PSA is now 15.6.  I think we should proceed with ultrasound and biopsy.  I put orders in. ----- Message ----- From: Interface, [facility] Lab Results In Sent: 11/08/2023   5:37 AM EST To: Marcine Matar, MD

## 2023-11-21 ENCOUNTER — Telehealth: Payer: Self-pay

## 2023-11-21 DIAGNOSIS — R972 Elevated prostate specific antigen [PSA]: Secondary | ICD-10-CM

## 2023-11-21 NOTE — Telephone Encounter (Signed)
 Tried calling patient with no answer, left vm for return call

## 2023-11-21 NOTE — Addendum Note (Signed)
 Addended by: Sarajane Jews on: 11/21/2023 11:15 AM   Modules accepted: Orders

## 2023-11-21 NOTE — Telephone Encounter (Signed)
 Pt called about his Rx for biopsy Pt was advised of biopsy appointment and instructions as well as was made aware that we would send out biopsy instructions by mail

## 2023-12-18 NOTE — Progress Notes (Signed)
 Gregory Parsons is here for TRUS/Bx for elevated PSA. Most recently 15.6. He does have a Rt sided prostate nodule.  Risks, benefits, and some of the potential complications of a transrectal ultrasounds of the prostate (TRUSP) with biopsies were discussed at length with the patient including gross hematuria, blood in the bowel movements, hematospermia, bacteremia, infection, voiding discomfort, urinary retention, fever, chills, sepsis, blood transfusion, death, and others. All questions were answered. Informed consent was obtained. The patient confirmed that he had taken his pre-procedure antibiotic. All anticoagulants were discontinued prior to the procedure. The patient emptied his bladder. He was positioned in a comfortable left lateral decubitus position with hips and knees acutely flexed.  The rectal probe was inserted into the rectum without difficulty. 10cc of 2% Lidocaine without epinephrine was instilled with a spinal needle using ultrasound guidance near the junction of each seminal vesicle and the prostate.  Sequential transverse (axial) scans were made in small increments beginning at the seminal vesicles and ending at the prostatic apex. Sequential longitudinal (saggital) scans were made in small increments beginning at the right lateral prostate and ending at the left lateral prostate. Excellent anatomical imaging was obtained. The peripheral, transitional, and central zones were well-defined. The seminal vesicles were normal.  Prostate volume *** ml.  There were no hypoechoic areas. 12 needle core biopsies were performed. 1 biopsy each was taken from the following areas:  Right lateral base, right medial base, right lateral mid prostate, right medial mid prostate, right lateral apical prostate, right medial apical prostate, left lateral base, left medial base, left lateral mid prostate, left medial mid prostate, left lateral apical prostate, left medial apical prostate.. Minimal prostatic  calcifications were noted. Excellent biopsy specimens were obtained.  Follow-up rectal examination was unremarkable. The procedure was well-tolerated and without complications. Antibiotic instructions were given. The patient was told that:  For several days:  he should increase his fluid intake and limit strenuous activity  he might have mild discomfort at the base of his penis or in his rectum  he might have blood in his urine or blood in his bowel movements  For 2-3 months:  he might have blood in his ejaculate (semen)  Instructions were given to call the office immedicately for blood clots in the urine or bowel movements, difficulty urinating, inability to urinate, urinary retention, painful or frequent urination, fever, chills, nausea, vomiting, or other illness. The patient stated that he understood these instructions and would comply with them. We told the patient that prostate biopsy pathology reports are usually available within 3-5 working days, unless a pathologic second opinion is required, which may take 7-14 days. We told him to contact us to check on the status of his biopsy if he has not heard from [place] within 7 days. The patient left the ultrasound examination room in stable condition.

## 2023-12-19 ENCOUNTER — Encounter (HOSPITAL_COMMUNITY): Payer: Self-pay

## 2023-12-19 ENCOUNTER — Ambulatory Visit (HOSPITAL_COMMUNITY)
Admission: RE | Admit: 2023-12-19 | Discharge: 2023-12-19 | Disposition: A | Discharge status: Home / Self Care | Source: Ambulatory Visit | Attending: Urology | Admitting: Urology

## 2023-12-19 ENCOUNTER — Ambulatory Visit (HOSPITAL_BASED_OUTPATIENT_CLINIC_OR_DEPARTMENT_OTHER): Admitting: Urology

## 2023-12-19 ENCOUNTER — Other Ambulatory Visit: Payer: Self-pay | Admitting: Urology

## 2023-12-19 DIAGNOSIS — N402 Nodular prostate without lower urinary tract symptoms: Secondary | ICD-10-CM

## 2023-12-19 DIAGNOSIS — C61 Malignant neoplasm of prostate: Secondary | ICD-10-CM

## 2023-12-19 DIAGNOSIS — R972 Elevated prostate specific antigen [PSA]: Secondary | ICD-10-CM | POA: Diagnosis not present

## 2023-12-19 MED ORDER — GENTAMICIN SULFATE 40 MG/ML IJ SOLN
160.0000 mg | Freq: Once | INTRAMUSCULAR | Status: AC
Start: 2023-12-19 — End: 2023-12-19
  Administered 2023-12-19: 160 mg via INTRAMUSCULAR

## 2023-12-19 MED ORDER — LIDOCAINE HCL (PF) 2 % IJ SOLN
10.0000 mL | Freq: Once | INTRAMUSCULAR | Status: AC
  Administered 2023-12-19: 10 mL via INTRADERMAL

## 2023-12-19 MED ORDER — LIDOCAINE HCL (PF) 2 % IJ SOLN
INTRAMUSCULAR | Status: AC
Start: 2023-12-19 — End: ?
  Filled 2023-12-19: qty 10

## 2023-12-19 MED ORDER — GENTAMICIN SULFATE 40 MG/ML IJ SOLN
INTRAMUSCULAR | Status: AC
  Filled 2023-12-19: qty 4

## 2023-12-19 MED ORDER — STERILE WATER FOR INJECTION IJ SOLN
INTRAMUSCULAR | Status: AC
  Filled 2023-12-19: qty 10

## 2023-12-19 NOTE — Progress Notes (Signed)
 Pt escorted to [place] Rm 1 , in no obvious distress. Prostate biopsy procedure explaine, questions answered, consent obtained. Prepped and draped. Atb admin without adverse event. Biopsy cores obtained without difficulty. US guidance employed. Cleanup performed. Patient dressed and went to bathroom, discharge papers given.

## 2023-12-20 LAB — SURGICAL PATHOLOGY

## 2024-01-01 ENCOUNTER — Telehealth: Payer: Self-pay

## 2024-01-01 NOTE — Telephone Encounter (Signed)
 Pt states he did not answer MD phone call because he was unaware of the phone number. He stated he can receive phone calls at 10:20 AM, 12:50PM and 2:45PM these are the times he is on break at work . Pt was advised to look out for follow up phone call. Pt also stated he had brownish color discharge in his semen pt advised that it can take up to 2-3 months for blood to stop coming out during ejaculation pt voiced his understanding

## 2024-01-03 ENCOUNTER — Other Ambulatory Visit: Payer: Self-pay | Admitting: Urology

## 2024-01-03 DIAGNOSIS — C61 Malignant neoplasm of prostate: Secondary | ICD-10-CM

## 2024-01-03 NOTE — Telephone Encounter (Signed)
 PET in the process of being authorized

## 2024-02-19 ENCOUNTER — Telehealth: Payer: Self-pay | Admitting: Urology

## 2024-02-19 ENCOUNTER — Other Ambulatory Visit: Payer: Self-pay | Admitting: Urology

## 2024-02-19 DIAGNOSIS — C61 Malignant neoplasm of prostate: Secondary | ICD-10-CM

## 2024-02-19 NOTE — Telephone Encounter (Signed)
 Patient wants to know what the next step is since in insurance will not cover the Pet Scan?

## 2024-02-20 NOTE — Telephone Encounter (Signed)
 Patient was made aware and voiced understanding "I put in orders for bone scan and CT scan instead. "

## 2024-03-05 ENCOUNTER — Other Ambulatory Visit: Payer: Self-pay | Admitting: Urology

## 2024-03-05 DIAGNOSIS — C61 Malignant neoplasm of prostate: Secondary | ICD-10-CM

## 2024-03-08 ENCOUNTER — Ambulatory Visit (HOSPITAL_COMMUNITY)
Admission: RE | Admit: 2024-03-08 | Discharge: 2024-03-08 | Disposition: A | Discharge status: Home / Self Care | Source: Ambulatory Visit | Attending: Urology | Admitting: Urology

## 2024-03-08 DIAGNOSIS — C61 Malignant neoplasm of prostate: Secondary | ICD-10-CM | POA: Insufficient documentation

## 2024-03-08 MED ORDER — IOHEXOL 300 MG/ML  SOLN
100.0000 mL | Freq: Once | INTRAMUSCULAR | Status: AC | PRN
  Administered 2024-03-08: 100 mL via INTRAVENOUS

## 2024-03-13 ENCOUNTER — Ambulatory Visit: Payer: Self-pay

## 2024-03-13 NOTE — Telephone Encounter (Signed)
-----   Message from Garnette HERO Dahlstedt sent at 03/13/2024  3:09 PM EDT ----- Please call patient-good news, CT showed no findings of cancer outside the prostate.  I also put an order for bone scan then.  Could you guys check to make sure that that gets done?  Thank you ----- Message ----- From: Interface, Rad Results In Sent: 03/08/2024   3:41 PM EDT To: Garnette Shack, MD

## 2024-03-13 NOTE — Telephone Encounter (Signed)
 Called pt to give him CT results per MD Dahlstedt pt voiced his understanding pt  was also advised of MD recommendations and that someone from our office will reach out to schedule the scan pt voiced his understanding

## 2024-04-03 ENCOUNTER — Other Ambulatory Visit: Payer: Self-pay | Admitting: Urology

## 2024-04-03 DIAGNOSIS — C61 Malignant neoplasm of prostate: Secondary | ICD-10-CM

## 2024-04-18 ENCOUNTER — Encounter (HOSPITAL_COMMUNITY): Payer: Self-pay

## 2024-04-18 ENCOUNTER — Encounter (HOSPITAL_COMMUNITY): Admission: RE | Admit: 2024-04-18 | Source: Ambulatory Visit

## 2024-04-25 ENCOUNTER — Encounter (HOSPITAL_COMMUNITY)
Admission: RE | Admit: 2024-04-25 | Discharge: 2024-04-25 | Disposition: A | Discharge status: Home / Self Care | Source: Ambulatory Visit | Attending: Urology | Admitting: Urology

## 2024-04-25 DIAGNOSIS — C61 Malignant neoplasm of prostate: Secondary | ICD-10-CM | POA: Diagnosis present

## 2024-04-25 MED ORDER — FLOTUFOLASTAT F 18 GALLIUM 296-5846 MBQ/ML IV SOLN
8.5400 | Freq: Once | INTRAVENOUS | Status: AC
  Administered 2024-04-25: 8.54 via INTRAVENOUS
  Filled 2024-04-25: qty 9

## 2024-05-07 ENCOUNTER — Other Ambulatory Visit: Payer: Self-pay | Admitting: Urology

## 2024-05-07 DIAGNOSIS — C61 Malignant neoplasm of prostate: Secondary | ICD-10-CM

## 2024-06-28 ENCOUNTER — Other Ambulatory Visit: Payer: Self-pay | Admitting: Urology

## 2024-07-12 NOTE — Progress Notes (Addendum)
 Anesthesia Review:  ERE:Tpoopjf Boyd,PA  Cardiologist : P[place]in LVO 07/01/24   PPM/ ICD: Device Orders: Rep Notified:  Chest x-ray : EKG : 11.4.2025  Echo : Stress test: Cardiac Cath :  2019   Activity level: can do a flight of stairs without difficulty  Sleep Study/ CPAP : none  Fasting Blood Sugar :      / Checks Blood Sugar -- times a day:    Blood Thinner/ Instructions /Last Dose: ASA / Instructions/ Last Dose :    81 mg aspirin     Blind in left eye    AT preop appt on 07/16/2024 pt states he had received some instructons from surgeon in regards to  drinking something prior to surgery.   PT is going to check when he gets home to make sure he still has this instructions and pt aware to follow those instructons .  IF he does no longer have pt has appt on 07/18/2024 with [facility] and pt aware to obtain a copy of this instructions and follow those.  PT voiced understanding.    EKG done at preop appt. One EKG shows critical value of STEMI .  Redid leads then EKG shows ST elevAtion.  Both EKGs shown to [facility],.  PT denies any chest pain, shortness of breath, dizziness, headache or blurred vision.  Critical value one was removed from  EKGs done.

## 2024-07-12 NOTE — Patient Instructions (Signed)
 SURGICAL WAITING ROOM VISITATION  Patients having surgery or a procedure may have no more than 2 support people in the waiting area - these visitors may rotate.    Children under the age of 73 must have an adult with them who is not the patient.  Visitors with respiratory illnesses are discouraged from visiting and should remain at home.  If the patient needs to stay at the hospital during part of their recovery, the visitor guidelines for inpatient rooms apply. Pre-op nurse will coordinate an appropriate time for 1 support person to accompany patient in pre-op.  This support person may not rotate.    Please refer to the [facility] website for the visitor guidelines for Inpatients (after your surgery is over and you are in a regular room).       Your procedure is scheduled on:  07/26/2024    Report to [facility] Main Entrance    Report to admitting at  0515 AM   Call this number if you have problems the morning of surgery [phone]   Do not eat food : or drink liquids After Midnight.                              If you have questions, please contact your surgeon's office.       Oral Hygiene is also important to reduce your risk of infection.                                    Remember - BRUSH YOUR TEETH THE MORNING OF SURGERY WITH YOUR REGULAR TOOTHPASTE  DENTURES WILL BE REMOVED PRIOR TO SURGERY PLEASE DO NOT APPLY Poly grip OR ADHESIVES!!!   Do NOT smoke after Midnight   Stop all vitamins and herbal supplements 7 days before surgery.   Take these medicines the morning of surgery with A SIP OF WATER :  amlodipine, coreg , protonix   DO NOT TAKE ANY ORAL DIABETIC MEDICATIONS DAY OF YOUR SURGERY  Bring CPAP mask and tubing day of surgery.                              You may not have any metal on your body including hair pins, jewelry, and body piercing             Do not wear make-up, lotions, powders, perfumes/cologne, or deodorant  Do not wear  nail polish including gel and S&S, artificial/acrylic nails, or any other type of covering on natural nails including finger and toenails. If you have artificial nails, gel coating, etc. that needs to be removed by a nail salon please have this removed prior to surgery or surgery may need to be canceled/ delayed if the surgeon/ anesthesia feels like they are unable to be safely monitored.   Do not shave  48 hours prior to surgery.               Men may shave face and neck.   Do not bring valuables to the hospital. [facility] IS NOT             RESPONSIBLE   FOR VALUABLES.   Contacts, glasses, dentures or bridgework may not be worn into surgery.   Bring small overnight bag day of surgery.   DO NOT BRING YOUR HOME MEDICATIONS TO  THE HOSPITAL. PHARMACY WILL DISPENSE MEDICATIONS LISTED ON YOUR MEDICATION LIST TO YOU DURING YOUR ADMISSION IN THE HOSPITAL!    Patients discharged on the day of surgery will not be allowed to drive home.  Someone NEEDS to stay with you for the first 24 hours after anesthesia.   Special Instructions: Bring a copy of your healthcare power of attorney and living will documents the day of surgery if you haven't scanned them before.              Please read over the following fact sheets you were given: IF YOU HAVE QUESTIONS ABOUT YOUR PRE-OP INSTRUCTIONS PLEASE CALL [phone].   If you received a COVID test during your pre-op visit  it is requested that you wear a mask when out in public, stay away from anyone that may not be feeling well and notify your surgeon if you develop symptoms. If you test positive for Covid or have been in contact with anyone that has tested positive in the last 10 days please notify you surgeon.    [facility] - Preparing for Surgery Before surgery, you can play an important role.  Because skin is not sterile, your skin needs to be as free of germs as possible.  You can reduce the number of germs on your skin by washing with CHG  (chlorahexidine gluconate) soap before surgery.  CHG is an antiseptic cleaner which kills germs and bonds with the skin to continue killing germs even after washing. Please DO NOT use if you have an allergy to CHG or antibacterial soaps.  If your skin becomes reddened/irritated stop using the CHG and inform your nurse when you arrive at Short Stay. Do not shave (including legs and underarms) for at least 48 hours prior to the first CHG shower.  You may shave your face/neck.  Please follow these instructions carefully:  1.  Shower with CHG Soap the night before surgery ONLY (DO NOT USE THE SOAP THE MORNING OF SURGERY).  2.  If you choose to wash your hair, wash your hair first as usual with your normal  shampoo.  3.  After you shampoo, rinse your hair and body thoroughly to remove the shampoo.                             4.  Use CHG as you would any other liquid soap.  You can apply chg directly to the skin and wash.  Gently with a scrungie or clean washcloth.  5.  Apply the CHG Soap to your body ONLY FROM THE NECK DOWN.   Do   not use on face/ open                           Wound or open sores. Avoid contact with eyes, ears mouth and   genitals (private parts).                       Wash face,  Genitals (private parts) with your normal soap.             6.  Wash thoroughly, paying special attention to the area where your    surgery  will be performed.  7.  Thoroughly rinse your body with warm water  from the neck down.  8.  DO NOT shower/wash with your normal soap after using and rinsing off the CHG Soap.  9.  Pat yourself dry with a clean towel.            10.  Wear clean pajamas.            11.  Place clean sheets on your bed the night of your first shower and do not  sleep with pets. Day of Surgery : Do not apply any CHG, lotions/deodorants the morning of surgery.  Please wear clean clothes to the hospital/surgery center.  FAILURE TO FOLLOW THESE INSTRUCTIONS MAY RESULT IN THE  CANCELLATION OF YOUR SURGERY  PATIENT SIGNATURE_________________________________  NURSE SIGNATURE__________________________________  ________________________________________________________________________

## 2024-07-16 ENCOUNTER — Encounter (HOSPITAL_COMMUNITY): Payer: Self-pay

## 2024-07-16 ENCOUNTER — Other Ambulatory Visit: Payer: Self-pay

## 2024-07-16 ENCOUNTER — Encounter (HOSPITAL_COMMUNITY)
Admission: RE | Admit: 2024-07-16 | Discharge: 2024-07-16 | Disposition: A | Discharge status: Home / Self Care | Source: Ambulatory Visit | Attending: Urology | Admitting: Urology

## 2024-07-16 VITALS — BP 154/90 | HR 73 | Temp 97.8°F | Resp 18 | Ht 64.0 in | Wt 214.1 lb

## 2024-07-16 DIAGNOSIS — Z01818 Encounter for other preprocedural examination: Secondary | ICD-10-CM | POA: Diagnosis present

## 2024-07-16 DIAGNOSIS — I428 Other cardiomyopathies: Secondary | ICD-10-CM | POA: Insufficient documentation

## 2024-07-16 DIAGNOSIS — Z87891 Personal history of nicotine dependence: Secondary | ICD-10-CM | POA: Insufficient documentation

## 2024-07-16 DIAGNOSIS — C61 Malignant neoplasm of prostate: Secondary | ICD-10-CM | POA: Insufficient documentation

## 2024-07-16 DIAGNOSIS — I10 Essential (primary) hypertension: Secondary | ICD-10-CM | POA: Diagnosis not present

## 2024-07-16 HISTORY — DX: Malignant (primary) neoplasm, unspecified: C80.1

## 2024-07-16 HISTORY — DX: Personal history of urinary calculi: Z87.442

## 2024-07-16 LAB — BASIC METABOLIC PANEL WITH GFR
Anion gap: 10 (ref 5–15)
BUN: 14 mg/dL (ref 6–20)
CO2: 23 mmol/L (ref 22–32)
Calcium: 9.3 mg/dL (ref 8.9–10.3)
Chloride: 106 mmol/L (ref 98–111)
Creatinine, Ser: 1.09 mg/dL (ref 0.61–1.24)
GFR, Estimated: >60 mL/min (ref >60)
Glucose, Bld: 107 mg/dL — ABNORMAL HIGH (ref 70–99)
Potassium: 4 mmol/L (ref 3.5–5.1)
Sodium: 139 mmol/L (ref 135–145)

## 2024-07-16 LAB — CBC
HCT: 44.3 % (ref 39.0–52.0)
Hemoglobin: 14.8 g/dL (ref 13.0–17.0)
MCH: 28.1 pg (ref 26.0–34.0)
MCHC: 33.4 g/dL (ref 30.0–36.0)
MCV: 84.1 fL (ref 80.0–100.0)
Platelets: 284 K/uL (ref 150–400)
RBC: 5.27 MIL/uL (ref 4.22–5.81)
RDW: 13 % (ref 11.5–15.5)
WBC: 5.8 K/uL (ref 4.0–10.5)
nRBC: 0 % (ref 0.0–0.2)
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
## 2024-07-25 NOTE — Anesthesia Preprocedure Evaluation (Signed)
 Anesthesia Evaluation  Patient identified by MRN, date of birth, ID band Patient awake    Reviewed: Allergy & Precautions, NPO status , Patient's Chart, lab work & pertinent test results, reviewed documented beta blocker date and time   History of Anesthesia Complications Negative for: history of anesthetic complications  Airway Mallampati: I  TM Distance: >3 FB Neck ROM: Full    Dental  (+) Dental Advisory Given, Missing   Pulmonary former smoker   breath sounds clear to auscultation       Cardiovascular hypertension, Pt. on medications and Pt. on home beta blockers (-) angina  Rhythm:Regular Rate:Normal  '19 cath:  The left ventricular systolic function is normal.  LV end diastolic pressure is normal.  The left ventricular ejection fraction is greater than 65% by visual estimate.  There is no mitral valve regurgitation.   1. No angiographic evidence of CAD 2. Normal LV systolic function 3. Non-cardiac chest pain  '21 ECHO:  The left ventricular size is normal.  There is normal left ventricular wall thickness.  Left ventricular systolic function is normal.  LV ejection fraction = 55-60%.  The left ventricular wall motion is normal.  The right ventricle is normal in size and function.  There is no significant valvular stenosis or regurgitation.     Neuro/Psych negative neurological ROS     GI/Hepatic Neg liver ROS,GERD  Controlled and Medicated,,  Endo/Other  BMI 37  Renal/GU H/o stones     Musculoskeletal   Abdominal   Peds  Hematology Hb 14.8, plt 284k   Anesthesia Other Findings   Reproductive/Obstetrics                              Anesthesia Physical Anesthesia Plan  ASA: 3  Anesthesia Plan: General   Post-op Pain Management: Tylenol  PO (pre-op)*   Induction: Intravenous  PONV Risk Score and Plan: 2 and Ondansetron  and Dexamethasone  Airway Management  Planned: Oral ETT  Additional Equipment: None  Intra-op Plan:   Post-operative Plan: Extubation in OR  Informed Consent: I have reviewed the patients History and Physical, chart, labs and discussed the procedure including the risks, benefits and alternatives for the proposed anesthesia with the patient or authorized representative who has indicated his/her understanding and acceptance.     Dental advisory given  Plan Discussed with: CRNA and Surgeon  Anesthesia Plan Comments:          Anesthesia Quick Evaluation

## 2024-07-26 ENCOUNTER — Ambulatory Visit (HOSPITAL_COMMUNITY): Payer: Self-pay | Admitting: Anesthesiology

## 2024-07-26 ENCOUNTER — Observation Stay (HOSPITAL_COMMUNITY)
Admission: RE | Admit: 2024-07-26 | Discharge: 2024-07-27 | Disposition: A | Discharge status: Home / Self Care | Attending: Urology | Admitting: Urology

## 2024-07-26 ENCOUNTER — Other Ambulatory Visit: Payer: Self-pay

## 2024-07-26 ENCOUNTER — Encounter (HOSPITAL_COMMUNITY)
Admission: RE | Disposition: A | Discharge status: Home / Self Care | Payer: Self-pay | Source: Home / Self Care | Attending: Urology

## 2024-07-26 ENCOUNTER — Encounter (HOSPITAL_COMMUNITY): Payer: Self-pay | Admitting: Urology

## 2024-07-26 ENCOUNTER — Ambulatory Visit (HOSPITAL_COMMUNITY): Payer: Self-pay | Admitting: Physician Assistant

## 2024-07-26 DIAGNOSIS — Z7982 Long term (current) use of aspirin: Secondary | ICD-10-CM | POA: Insufficient documentation

## 2024-07-26 DIAGNOSIS — Z79899 Other long term (current) drug therapy: Secondary | ICD-10-CM | POA: Diagnosis not present

## 2024-07-26 DIAGNOSIS — F1090 Alcohol use, unspecified, uncomplicated: Secondary | ICD-10-CM | POA: Diagnosis not present

## 2024-07-26 DIAGNOSIS — I10 Essential (primary) hypertension: Secondary | ICD-10-CM | POA: Diagnosis not present

## 2024-07-26 DIAGNOSIS — Z87891 Personal history of nicotine dependence: Secondary | ICD-10-CM | POA: Diagnosis not present

## 2024-07-26 DIAGNOSIS — Z01818 Encounter for other preprocedural examination: Secondary | ICD-10-CM

## 2024-07-26 DIAGNOSIS — C61 Malignant neoplasm of prostate: Principal | ICD-10-CM | POA: Diagnosis present

## 2024-07-26 HISTORY — PX: ROBOT ASSISTED LAPAROSCOPIC RADICAL PROSTATECTOMY: SHX5141

## 2024-07-26 LAB — HEMOGLOBIN AND HEMATOCRIT, BLOOD
HCT: 41 % (ref 39.0–52.0)
Hemoglobin: 14.4 g/dL (ref 13.0–17.0)

## 2024-07-26 LAB — TYPE AND SCREEN
ABO/RH(D): O POS
Antibody Screen: NEGATIVE

## 2024-07-26 LAB — ABO/RH: ABO/RH(D): O POS

## 2024-07-26 SURGERY — PROSTATECTOMY, RADICAL, ROBOT-ASSISTED, LAPAROSCOPIC
Anesthesia: General | Site: Abdomen

## 2024-07-26 MED ORDER — MIDAZOLAM HCL 2 MG/2ML IJ SOLN
INTRAMUSCULAR | Status: AC
  Filled 2024-07-26: qty 2

## 2024-07-26 MED ORDER — SURGIFLO WITH THROMBIN (HEMOSTATIC MATRIX KIT) OPTIME
TOPICAL | Status: DC | PRN
  Administered 2024-07-26: 1 via TOPICAL

## 2024-07-26 MED ORDER — DOCUSATE SODIUM 100 MG PO CAPS
100.0000 mg | ORAL_CAPSULE | Freq: Two times a day (BID) | ORAL | Status: AC
Start: 2024-07-26 — End: ?

## 2024-07-26 MED ORDER — ALBUTEROL SULFATE HFA 108 (90 BASE) MCG/ACT IN AERS
INHALATION_SPRAY | RESPIRATORY_TRACT | Status: AC
  Filled 2024-07-26: qty 6.7

## 2024-07-26 MED ORDER — CARVEDILOL 3.125 MG PO TABS
3.1250 mg | ORAL_TABLET | Freq: Two times a day (BID) | ORAL | Status: DC
  Administered 2024-07-26 – 2024-07-27 (×2): 3.125 mg via ORAL
  Filled 2024-07-26 (×2): qty 1

## 2024-07-26 MED ORDER — PANTOPRAZOLE SODIUM 40 MG PO TBEC
40.0000 mg | DELAYED_RELEASE_TABLET | Freq: Every day | ORAL | Status: DC
  Administered 2024-07-26 – 2024-07-27 (×2): 40 mg via ORAL
  Filled 2024-07-26 (×2): qty 1

## 2024-07-26 MED ORDER — HYDROMORPHONE HCL 1 MG/ML IJ SOLN
0.2500 mg | INTRAMUSCULAR | Status: DC | PRN
  Administered 2024-07-26 (×4): 0.5 mg via INTRAVENOUS

## 2024-07-26 MED ORDER — ROCURONIUM 10MG/ML (10ML) SYRINGE FOR MEDFUSION PUMP - OPTIME
INTRAVENOUS | Status: DC | PRN
  Administered 2024-07-26: 20 mg via INTRAVENOUS
  Administered 2024-07-26: 60 mg via INTRAVENOUS
  Administered 2024-07-26 (×2): 40 mg via INTRAVENOUS

## 2024-07-26 MED ORDER — ORAL CARE MOUTH RINSE: 15.0000 mL | OROMUCOSAL | Status: DC | PRN

## 2024-07-26 MED ORDER — ACETAMINOPHEN 500 MG PO TABS
1000.0000 mg | ORAL_TABLET | Freq: Four times a day (QID) | ORAL | Status: AC
  Administered 2024-07-26 – 2024-07-27 (×4): 1000 mg via ORAL
  Filled 2024-07-26 (×4): qty 2

## 2024-07-26 MED ORDER — LACTATED RINGERS IV SOLN: INTRAVENOUS | Status: DC

## 2024-07-26 MED ORDER — SULFAMETHOXAZOLE-TRIMETHOPRIM 800-160 MG PO TABS: 1.0000 | ORAL_TABLET | Freq: Two times a day (BID) | ORAL | 0 refills | Status: AC

## 2024-07-26 MED ORDER — PROPOFOL 10 MG/ML IV BOLUS
INTRAVENOUS | Status: AC
  Filled 2024-07-26: qty 20

## 2024-07-26 MED ORDER — HYDROMORPHONE HCL 1 MG/ML IJ SOLN
INTRAMUSCULAR | Status: DC | PRN
  Administered 2024-07-26 (×2): 1 mg via INTRAVENOUS

## 2024-07-26 MED ORDER — ROCURONIUM BROMIDE 10 MG/ML (PF) SYRINGE
PREFILLED_SYRINGE | INTRAVENOUS | Status: AC
  Filled 2024-07-26: qty 10

## 2024-07-26 MED ORDER — OXYCODONE HCL 5 MG PO TABS: 5.0000 mg | ORAL_TABLET | Freq: Once | ORAL | Status: DC | PRN

## 2024-07-26 MED ORDER — HYOSCYAMINE SULFATE 0.125 MG SL SUBL: 0.1250 mg | SUBLINGUAL_TABLET | SUBLINGUAL | Status: DC | PRN

## 2024-07-26 MED ORDER — DIPHENHYDRAMINE HCL 12.5 MG/5ML PO ELIX: 12.5000 mg | ORAL_SOLUTION | Freq: Four times a day (QID) | ORAL | Status: DC | PRN

## 2024-07-26 MED ORDER — FENTANYL CITRATE (PF) 100 MCG/2ML IJ SOLN
INTRAMUSCULAR | Status: AC
  Filled 2024-07-26: qty 2

## 2024-07-26 MED ORDER — LOSARTAN POTASSIUM 50 MG PO TABS
100.0000 mg | ORAL_TABLET | Freq: Every day | ORAL | Status: DC
  Administered 2024-07-26 – 2024-07-27 (×2): 100 mg via ORAL
  Filled 2024-07-26 (×2): qty 2

## 2024-07-26 MED ORDER — LACTATED RINGERS IR SOLN
Status: DC | PRN
  Administered 2024-07-26: 1000 mL

## 2024-07-26 MED ORDER — BUPIVACAINE LIPOSOME 1.3 % IJ SUSP
INTRAMUSCULAR | Status: AC
  Filled 2024-07-26: qty 20

## 2024-07-26 MED ORDER — DIPHENHYDRAMINE HCL 50 MG/ML IJ SOLN: 12.5000 mg | Freq: Four times a day (QID) | INTRAMUSCULAR | Status: DC | PRN

## 2024-07-26 MED ORDER — ONDANSETRON HCL 4 MG/2ML IJ SOLN
INTRAMUSCULAR | Status: DC | PRN
  Administered 2024-07-26: 4 mg via INTRAVENOUS

## 2024-07-26 MED ORDER — DOCUSATE SODIUM 100 MG PO CAPS
100.0000 mg | ORAL_CAPSULE | Freq: Two times a day (BID) | ORAL | Status: DC
  Administered 2024-07-26 – 2024-07-27 (×2): 100 mg via ORAL
  Filled 2024-07-26 (×2): qty 1

## 2024-07-26 MED ORDER — CHLORHEXIDINE GLUCONATE 0.12 % MT SOLN
15.0000 mL | Freq: Once | OROMUCOSAL | Status: AC
  Administered 2024-07-26: 15 mL via OROMUCOSAL

## 2024-07-26 MED ORDER — ONDANSETRON HCL 4 MG/2ML IJ SOLN: 4.0000 mg | INTRAMUSCULAR | Status: DC | PRN

## 2024-07-26 MED ORDER — FENTANYL CITRATE (PF) 250 MCG/5ML IJ SOLN
INTRAMUSCULAR | Status: DC | PRN
Start: 2024-07-26 — End: 2024-07-26
  Administered 2024-07-26: 100 ug via INTRAVENOUS

## 2024-07-26 MED ORDER — SODIUM CHLORIDE 0.9 % IV BOLUS
1000.0000 mL | Freq: Once | INTRAVENOUS | Status: AC
  Administered 2024-07-26: 1000 mL via INTRAVENOUS

## 2024-07-26 MED ORDER — SODIUM CHLORIDE 0.45 % IV SOLN
INTRAVENOUS | Status: DC
  Administered 2024-07-26: 75 mL/h via INTRAVENOUS

## 2024-07-26 MED ORDER — SODIUM CHLORIDE (PF) 0.9 % IJ SOLN
INTRAMUSCULAR | Status: DC | PRN
  Administered 2024-07-26: 40 mL

## 2024-07-26 MED ORDER — MEPERIDINE HCL 25 MG/ML IJ SOLN: 6.2500 mg | INTRAMUSCULAR | Status: DC | PRN

## 2024-07-26 MED ORDER — TRIPLE ANTIBIOTIC 3.5-400-5000 EX OINT: 1.0000 | TOPICAL_OINTMENT | Freq: Three times a day (TID) | CUTANEOUS | Status: DC | PRN

## 2024-07-26 MED ORDER — DEXMEDETOMIDINE HCL IN NACL 80 MCG/20ML IV SOLN
INTRAVENOUS | Status: DC | PRN
  Administered 2024-07-26 (×2): 10 ug via INTRAVENOUS

## 2024-07-26 MED ORDER — OXYCODONE HCL 5 MG PO TABS: 5.0000 mg | ORAL_TABLET | ORAL | Status: DC | PRN

## 2024-07-26 MED ORDER — SUGAMMADEX SODIUM 200 MG/2ML IV SOLN
INTRAVENOUS | Status: DC | PRN
  Administered 2024-07-26: 200 mg via INTRAVENOUS

## 2024-07-26 MED ORDER — LIDOCAINE HCL (PF) 2 % IJ SOLN
INTRAMUSCULAR | Status: AC
  Filled 2024-07-26: qty 5

## 2024-07-26 MED ORDER — ORAL CARE MOUTH RINSE: 15.0000 mL | Freq: Once | OROMUCOSAL | Status: AC

## 2024-07-26 MED ORDER — LIDOCAINE 2% (20 MG/ML) 5 ML SYRINGE
INTRAMUSCULAR | Status: DC | PRN
  Administered 2024-07-26: 40 mg via INTRAVENOUS

## 2024-07-26 MED ORDER — PROPOFOL 10 MG/ML IV BOLUS
INTRAVENOUS | Status: AC
Start: 2024-07-26 — End: 2024-07-26
  Filled 2024-07-26: qty 20

## 2024-07-26 MED ORDER — ACETAMINOPHEN 500 MG PO TABS
1000.0000 mg | ORAL_TABLET | Freq: Once | ORAL | Status: AC
  Administered 2024-07-26: 1000 mg via ORAL
  Filled 2024-07-26: qty 2

## 2024-07-26 MED ORDER — ALBUTEROL SULFATE HFA 108 (90 BASE) MCG/ACT IN AERS
INHALATION_SPRAY | RESPIRATORY_TRACT | Status: DC | PRN
  Administered 2024-07-26 (×3): 2 via RESPIRATORY_TRACT

## 2024-07-26 MED ORDER — OXYCODONE HCL 5 MG/5ML PO SOLN: 5.0000 mg | Freq: Once | ORAL | Status: DC | PRN

## 2024-07-26 MED ORDER — CEFAZOLIN SODIUM-DEXTROSE 2-4 GM/100ML-% IV SOLN
2.0000 g | INTRAVENOUS | Status: AC
  Administered 2024-07-26 (×2): 2 g via INTRAVENOUS
  Filled 2024-07-26: qty 100

## 2024-07-26 MED ORDER — HYDROCODONE-ACETAMINOPHEN 5-325 MG PO TABS: 1.0000 | ORAL_TABLET | Freq: Four times a day (QID) | ORAL | 0 refills | Status: AC | PRN

## 2024-07-26 MED ORDER — SUGAMMADEX SODIUM 200 MG/2ML IV SOLN
INTRAVENOUS | Status: AC
  Filled 2024-07-26: qty 2

## 2024-07-26 MED ORDER — PROPOFOL 10 MG/ML IV BOLUS
INTRAVENOUS | Status: DC | PRN
  Administered 2024-07-26: 200 mg via INTRAVENOUS

## 2024-07-26 MED ORDER — HYDROMORPHONE HCL 2 MG/ML IJ SOLN
INTRAMUSCULAR | Status: AC
  Filled 2024-07-26: qty 1

## 2024-07-26 MED ORDER — HYDROMORPHONE HCL 1 MG/ML IJ SOLN
INTRAMUSCULAR | Status: AC
  Filled 2024-07-26: qty 2

## 2024-07-26 MED ORDER — SODIUM CHLORIDE (PF) 0.9 % IJ SOLN
INTRAMUSCULAR | Status: AC
  Filled 2024-07-26: qty 50

## 2024-07-26 MED ORDER — DEXAMETHASONE SOD PHOSPHATE PF 10 MG/ML IJ SOLN
INTRAMUSCULAR | Status: DC | PRN
  Administered 2024-07-26: 10 mg via INTRAVENOUS

## 2024-07-26 MED ORDER — MIDAZOLAM HCL (PF) 2 MG/2ML IJ SOLN: 0.5000 mg | Freq: Once | INTRAMUSCULAR | Status: DC | PRN

## 2024-07-26 MED ORDER — ONDANSETRON HCL 4 MG/2ML IJ SOLN
INTRAMUSCULAR | Status: AC
  Filled 2024-07-26: qty 2

## 2024-07-26 MED ORDER — CEFAZOLIN SODIUM 1 G IJ SOLR
INTRAMUSCULAR | Status: AC
Start: 2024-07-26 — End: 2024-07-26
  Filled 2024-07-26: qty 20

## 2024-07-26 MED ORDER — AMLODIPINE BESYLATE 5 MG PO TABS
5.0000 mg | ORAL_TABLET | Freq: Every day | ORAL | Status: DC
  Administered 2024-07-27: 5 mg via ORAL
  Filled 2024-07-26: qty 1

## 2024-07-26 MED ORDER — HYDROMORPHONE HCL 1 MG/ML IJ SOLN: 0.5000 mg | INTRAMUSCULAR | Status: DC | PRN

## 2024-07-26 MED ORDER — MIDAZOLAM HCL (PF) 2 MG/2ML IJ SOLN
INTRAMUSCULAR | Status: DC | PRN
  Administered 2024-07-26: 2 mg via INTRAVENOUS

## 2024-07-26 SURGICAL SUPPLY — 61 items
APPLICATOR COTTON TIP 6 STRL (MISCELLANEOUS) ×2 IMPLANT
APPLICATOR SURGIFLO ENDO (HEMOSTASIS) IMPLANT
BAG COUNTER SPONGE SURGICOUNT (BAG) IMPLANT
CATH FOLEY 2WAY SLVR 5CC 18FR (CATHETERS) ×2 IMPLANT
CATH ROBINSON RED A/P 16FR (CATHETERS) ×2 IMPLANT
CATH SILICONE 5CC 18FR (INSTRUMENTS) ×2 IMPLANT
CHLORAPREP W/TINT 26 (MISCELLANEOUS) ×2 IMPLANT
CLIP LIGATING HEM O LOK PURPLE (MISCELLANEOUS) ×2 IMPLANT
COVER SURGICAL LIGHT HANDLE (MISCELLANEOUS) ×2 IMPLANT
COVER TIP SHEARS 8 DVNC (MISCELLANEOUS) ×2 IMPLANT
CUTTER ECHEON FLEX ENDO 45 340 (ENDOMECHANICALS) ×2 IMPLANT
DERMABOND ADVANCED .7 DNX12 (GAUZE/BANDAGES/DRESSINGS) ×2 IMPLANT
DEVICE STATLOCK FOLEY SWIVEL S (MISCELLANEOUS) ×2 IMPLANT
DRAPE ARM DVNC X/XI (DISPOSABLE) ×8 IMPLANT
DRAPE COLUMN DVNC XI (DISPOSABLE) ×2 IMPLANT
DRAPE SURG IRRIG POUCH 19X23 (DRAPES) ×2 IMPLANT
DRIVER NDL LRG 8 DVNC XI (INSTRUMENTS) ×4 IMPLANT
DRIVER NDLE LRG 8 DVNC XI (INSTRUMENTS) ×4 IMPLANT
DRSG TEGADERM 4X4.75 (GAUZE/BANDAGES/DRESSINGS) ×2 IMPLANT
ELECT PENCIL ROCKER SW 15FT (MISCELLANEOUS) ×2 IMPLANT
ELECT REM PT RETURN 15FT ADLT (MISCELLANEOUS) ×2 IMPLANT
FORCEPS BPLR 8 MD DVNC XI (FORCEP) ×2 IMPLANT
FORCEPS BPLR FENES DVNC XI (FORCEP) ×2 IMPLANT
FORCEPS PROGRASP DVNC XI (FORCEP) ×2 IMPLANT
GAUZE 4X4 16PLY ~~LOC~~+RFID DBL (SPONGE) IMPLANT
GAUZE SPONGE 4X4 12PLY STRL (GAUZE/BANDAGES/DRESSINGS) ×2 IMPLANT
GLOVE BIO SURGEON STRL SZ 6.5 (GLOVE) ×2 IMPLANT
GLOVE BIOGEL M 7.0 STRL (GLOVE) ×4 IMPLANT
GLOVE BIOGEL PI IND STRL 7.5 (GLOVE) ×4 IMPLANT
GOWN STRL REUS W/ TWL XL LVL3 (GOWN DISPOSABLE) ×4 IMPLANT
GOWN STRL SURGICAL XL XLNG (GOWN DISPOSABLE) ×2 IMPLANT
HEMOSTAT SURGICEL 4X8 (HEMOSTASIS) IMPLANT
IRRIGATION SUCT STRKRFLW 2 WTP (MISCELLANEOUS) ×2 IMPLANT
IV LACTATED RINGERS 1000ML (IV SOLUTION) ×2 IMPLANT
KIT TURNOVER KIT A (KITS) ×2 IMPLANT
MARKER SKIN DUAL TIP RULER LAB (MISCELLANEOUS) ×2 IMPLANT
NDL INSUFFLATION 14GA 120MM (NEEDLE) ×2 IMPLANT
NEEDLE INSUFFLATION 14GA 120MM (NEEDLE) ×2 IMPLANT
PACK ROBOT UROLOGY CUSTOM (CUSTOM PROCEDURE TRAY) ×2 IMPLANT
PLUG CATH AND CAP STRL 200 (CATHETERS) IMPLANT
PROTECTOR NERVE ULNAR (MISCELLANEOUS) ×2 IMPLANT
RELOAD STAPLE 45 4.1 GRN THCK (STAPLE) ×2 IMPLANT
SCISSORS LAP 5X45 EPIX DISP (ENDOMECHANICALS) IMPLANT
SCISSORS MNPLR CVD DVNC XI (INSTRUMENTS) ×2 IMPLANT
SEAL UNIV 5-12 XI (MISCELLANEOUS) ×8 IMPLANT
SET TUBE SMOKE EVAC HIGH FLOW (TUBING) ×2 IMPLANT
SOL PREP POV-IOD 4OZ 10% (MISCELLANEOUS) ×2 IMPLANT
SOLUTION ELECTROSURG ANTI STCK (MISCELLANEOUS) ×2 IMPLANT
SURGIFLO W/THROMBIN 8M KIT (HEMOSTASIS) IMPLANT
SUT ETHILON 2 0 PS N (SUTURE) ×2 IMPLANT
SUT MNCRL 3 0 VIOLET RB1 (SUTURE) IMPLANT
SUT MNCRL AB 4-0 PS2 18 (SUTURE) ×4 IMPLANT
SUT PDS AB 0 CT1 36 (SUTURE) ×4 IMPLANT
SUT VIC AB 0 CT1 27XBRD ANTBC (SUTURE) ×2 IMPLANT
SUT VIC AB 2-0 SH 27XBRD (SUTURE) ×2 IMPLANT
SUT VIC AB 3-0 SH 27X BRD (SUTURE) IMPLANT
SUT VIC AB 4-0 RB1 27XBRD (SUTURE) ×2 IMPLANT
SUT VLOC 3-0 9IN GRN (SUTURE) IMPLANT
SUTURE VLOC BRB 180 ABS3/0GR12 (SUTURE) ×2 IMPLANT
TROCAR Z THREAD OPTICAL 12X100 (TROCAR) IMPLANT
WATER STERILE IRR 1000ML POUR (IV SOLUTION) ×2 IMPLANT

## 2024-07-26 NOTE — Transfer of Care (Signed)
 Immediate Anesthesia Transfer of Care Note  Patient: Gregory Parsons  Procedure(s) Performed: PROSTATECTOMY, RADICAL, ROBOT-ASSISTED, LAPAROSCOPIC (Abdomen) LYMPHADENECTOMY, PELVIS, ROBOT-ASSISTED (Bilateral: Abdomen)  Patient Location: PACU  Anesthesia Type:General  Level of Consciousness: drowsy and patient cooperative  Airway & Oxygen Therapy: Patient Spontanous Breathing  Post-op Assessment: Report given to RN and Post -op Vital signs reviewed and stable  Post vital signs: Reviewed and stable  Last Vitals:  Vitals Value Taken Time  BP 188/100 07/26/24 12:03  Temp 36.5 C 07/26/24 12:04  Pulse 96 07/26/24 12:06  Resp 21 07/26/24 12:06  SpO2 97 % 07/26/24 12:06  Vitals shown include unfiled device data.  Last Pain:  Vitals:   07/26/24 0628  TempSrc:   PainSc: 0-No pain         Complications: No notable events documented.
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
## 2024-07-26 NOTE — Op Note (Signed)
 Operative Note  Preoperative diagnosis:  1.  Localized prostate cancer  Postoperative diagnosis: 1.  Localized prostate cancer  Procedure(s): 1.  Robotic assisted laparoscopic radical prostatectomy (unilateral left nerve sparing) 2.  Robotic assisted laparoscopic bilateral pelvic lymph node dissection  Surgeon: Donnice Siad, MD  Assistants:   Alan Hammonds, PA  An assistant was required for this surgical procedure.  The duties of the assistant included but were not limited to suctioning, passing suture, camera manipulation, retraction.  This procedure would not be able to be performed without an geophysicist/field seismologist.   Anesthesia:  General  Complications:  None  EBL:  50ml  Specimens: 1.  Prostate with seminal vesicles 2.  Periprostatic fat 3. Bilateral pelvic lymph nodes  Drains/Catheters: 1.  18 French Foley catheter  Intraoperative findings:   Approximately 40cc prostate.  No accessory pudendal vessels.  Successful unilateral left nerve spare.  Excellent hemostasis.  Water -tight anastomosis without leak.  Indication:  Gregory Parsons is a 58 y.o. malewho initially presented with an elevated PSA.  Prostate biopsy showed Gleason 4+3 prostate cancer involving the right side and 3+4 involving the left side prostate.  Treatment options were discussed with him at length and he chose robotic assisted laparoscopic radical prostatectomy.  He has erectile dysfunction and the plan is for unilateral left sparing.  Bilateral pelvic lymph node dissection was planned due to his risk stratification.  Description of procedure: The indications, alternatives, benefits, and risks were discussed with the patient and informed symptoms obtained.  The patient was brought to the operating room table, positioned supine and secured to the bed with a safety strap.  All pressure points were carefully padded and pneumatic compression devices were placed on lower extremities.  After the administration of intravenous  antibiotics and general endotracheal anesthesia, the patient was repositioned in the dorsal lithotomy position using well-padded Allen stirrups.  The arms were carefully tucked at the patient's side and secured with padding.  The chest was secured in place with foam padding and cloth tape and the table was positioned in approximately 30 degree Trendelenburg.  A rectal exam showed the prostate to be 40 cc, with a firm nodule on the right and not fixed. The patient's abdomen, genitalia, and upper thighs were prepped and draped in the standard sterile manner.  A time was completed, verifying the correct patient, surgical procedure and positioning prior to beginning the procedure.  An 20 French urethral catheter was inserted to drain the bladder.  Pneumoperitoneum was introduced using the open Hassan technique and insufflated with CO2 to a pressure of 15 mmHg. An 8 mm blunt tip trocar was placed just above the umbilicus.  The 0 degree camera was then passed under direct visualization.  The abdominal cavity was examined for any sign of injury, adhesions, and identification of anatomic landmarks.  The remainder of the trochars were placed which included 2 separate 8 millimeter robotic trochars which were placed 9 cm laterally and inferiorly to the initially placed camera trocar.  A 12 mm trocar was placed 8 mm lateral to the right robotic trocar.  A separate 8 mm robotic trocar was placed 8 cm lateral to the previously placed left robotic trocar on the left side.  A 5 mm trocar was placed to the right and well above the umbilicus which approximately 10 and 12 cm away from the right-sided trochars.  The robot was then docked.  I placed monopolar scissors in the right hand, a fenestrated bipolar in the left hand and a  prograsp in the fourth arm.  The urachus and median umbilical ligament was divided and we developed the space of Retzius down to the pubic bone.  I divided the parietal peritoneum laterally up to the vas  deferens on each side.  Using the prograsp forcep to provide cranial traction on the urachus, the prostate was then defatted above the prostatic vesicle junction, and the superficial dorsal venous complex was coagulated with bipolar and divided.  We submitted the periprostatic fat for pathological specimen.  The endopelvic fascia was sharply opened bilaterally and the levator muscle fibers were swept posterior laterally allowing for visualization of the deep dorsal venous complex and apex of the prostate.  The puboprostatic ligaments were sharply divided and care was taken to preserve the dorsal venous complex.  I secured the dorsal venous complex with a battery operated stapler.  I then addressed the bladder neck. I identified the bladder neck by pulling a Foley catheter.  The fourth arm was applying cranial traction on the urachus.  I divided the anterior bladder neck musculature until I found the anterior bladder neck mucosa which was then incised. I identified the Foley catheter within, the balloon was deflated, we pulled the Foley catheter out into the operating field.  The assistant then used a grasper to apply traction to the Foley catheter and the surgical tech then placed a [place] on the catheter near the penis for optimal retraction.  I then divided the lateral bladder neck mucosa in the posterior bladder neck mucosa.  I was well away from the bilateral ureteral orifices.  I divided the posterior bladder neck musculature until I discovered the longitudinal fibers and kept dissecting until I found the vas deferens.  The bilateral vas deferens were then freed and divided.  I then freed the bilateral seminal vesicles using blunt and sharp dissection.  I placed metal clips on the seminal vesicle vessels and avoided cautery around the seminal vesicles.  I then switched back to a 0 degree lens.  I divided the Denonvilliers fascia beneath the prostate and developed the prostate off the rectum. Of note, this  plane was stuck bilaterally. This plane was bluntly developed towards the apex of the prostate.  I then addressed the pedicles, first starting with the right and then moving towards the left. I then did a unilateral nerve spare on the left by dividing the lateral pelvic fascia off of the prostatic capsule laterally.  I then isolated the pedicles of the prostate and placed Weck clips on the pedicles and then divided the pedicles with cold scissors.  I continued to divide the neurovascular bundles off of the prostate out to the apex of the prostate.  At this point the prostate was essentially freed up except for the urethra.   I then addressed the prostate anteriorly, dividing the dorsal vein with cautery.  The anterior urethra was then sharply divided with cold scissors.  The Foley catheter was then pulled back and we divided the posterior urethral wall. Patent venous sinuses were then oversewn with a 4-0 Vicryl suture.  The specimen was then placed in a Endo Catch bag and then the bag was placed in the upper abdomen out of the way.  I then irrigated the pelvis.  We performed a rectal test by instilling air into the rectal Foley.  The test was negative.  There is no concern for rectal injury.  I then over sewed a few bleeders alongside the pedicles with a 4-0 Vicryl suture on an RB1  needle.  I then performed a bilateral pelvic lymph node dissection by incising the fascia overlying the right external iliac vein, dissecting distally.  I went just distal to the node of [place] were replaced clips and then divided the lymphatics.  The lateral aspect of the dissection was the pelvic sidewall, inferior was the obturator nerve and proximal of the hypogastric vessels.  I placed clips at the proximal aspect and then divided the lymphatics.  The specimen was removed with the scope grasper and sent to pathology.  Grossly, there were no enlarged lymph nodes.  This was performed on both the right and left pelvic lymph  nodes.  With good hemostasis confirmed, I then did the posterior reconstruction with a Rocco stitch.  I used a 3-0 Vloc double-armed suture on an RB1 needle.  I passed the sutures through the cut edges of Denonvilliers fascia beneath the bladder on the right side and through the posterior serrated sphincter underneath the urethra.  I ran this from right to left.  And then took the other end of the suture passing just proximal to the posterior bladder neck in the midline into the posterior serrated sphincter and around this from right to left using 3 throws and reapproximated the sutures.  I then completed the urethrovesical anastomosis using a 3-0 Vloc suture on an RB1 needle.  I passed both ends of the suture from the outside in through the bladder neck at the 6 o'clock position.  I passed both through the urethral stump from the inside out and the corresponding position.  I reapproximated the bladder neck to the urethra.  I then ran the left suture on the left side anastomosis to the 9 o'clock position.  And then went back to the right-sided suture around that up the right side of the 12 o'clock position.  I then continued the left suture to the 12 o'clock position. I identified the ureteral orifices and ensured that these were not incorporated with the sutures.  I then placed a new 18 French Foley catheter into the bladder and filled it with 10 cc of sterile water .  I then secured the knot and then passed the suture behind the pubic bone for anterior suspension.  The bladder was irrigated with 200 cc of water .  There was no leak.  Hemostasis was excellent.  A 15 French drain was placed through the previously placed fourth arm.  We did place a Carter-Thomason 0 vicryl suture through the 12 mm assistant port. The robot was undocked and all the trochars were removed under direct vision.    I enlarged the umbilical trocar site large enough to remove the prostate and closed the fascia with 0 PDS sutures in a  running fashion.  All the port sites were irrigated.  Exparel was injected to the trocar sites.  The skin was closed with 4-0 Monocryl in a running subcuticular fashion.  Skin glue was applied.  At this point, the patient was extubated and awakened in the operating room and taken to recovery room in stable condition.  There were no immediate complications.  All counts were correct.  Plan: Admit for observation overnight.  Clear liquids tonight.  Regular for breakfast tomorrow.  Anticipate discharge home tomorrow.  Matt R. Fahima Cifelli MD [facility]  Pager: [phone]

## 2024-07-26 NOTE — Plan of Care (Signed)
  Problem: Education: Goal: Knowledge of the procedure and recovery process will improve Outcome: Progressing   Problem: Pain Management: Goal: General experience of comfort will improve Outcome: Progressing   Problem: Urinary Elimination: Goal: Ability to achieve and maintain urine output will improve Outcome: Progressing   Problem: Education: Goal: Knowledge of General Education information will improve Description: Including pain rating scale, medication(s)/side effects and non-pharmacologic comfort measures Outcome: Progressing   Problem: Clinical Measurements: Goal: Respiratory complications will improve Outcome: Progressing

## 2024-07-26 NOTE — Discharge Instructions (Signed)

## 2024-07-26 NOTE — Anesthesia Procedure Notes (Signed)
 Procedure Name: Intubation Date/Time: 07/26/2024 7:39 AM  Performed by: Nanci Riis, CRNAPre-anesthesia Checklist: Patient identified, Emergency Drugs available, Suction available, Patient being monitored and Timeout performed Patient Re-evaluated:Patient Re-evaluated prior to induction Oxygen Delivery Method: Circle system utilized Preoxygenation: Pre-oxygenation with 100% oxygen Induction Type: IV induction Ventilation: Mask ventilation without difficulty Laryngoscope Size: Miller and 3 Grade View: Grade I Tube type: Oral Tube size: 7.5 mm Number of attempts: 1 Airway Equipment and Method: Stylet Placement Confirmation: ETT inserted through vocal cords under direct vision, positive ETCO2 and breath sounds checked- equal and bilateral Secured at: 22 cm Tube secured with: Tape Dental Injury: Teeth and Oropharynx as per pre-operative assessment

## 2024-07-26 NOTE — Anesthesia Postprocedure Evaluation (Signed)
 Anesthesia Post Note  Patient: TELL ROZELLE  Procedure(s) Performed: PROSTATECTOMY, RADICAL, ROBOT-ASSISTED, LAPAROSCOPIC (Abdomen) LYMPHADENECTOMY, PELVIS, ROBOT-ASSISTED (Bilateral: Abdomen)     Patient location during evaluation: PACU Anesthesia Type: General Level of consciousness: awake and alert, patient cooperative and oriented Pain management: pain level controlled Vital Signs Assessment: post-procedure vital signs reviewed and stable Respiratory status: spontaneous breathing, nonlabored ventilation, respiratory function stable and patient connected to nasal cannula oxygen Cardiovascular status: blood pressure returned to baseline and stable Postop Assessment: no apparent nausea or vomiting Anesthetic complications: no   No notable events documented.  Last Vitals:  Vitals:   07/26/24 1345 07/26/24 1402  BP: (!) 165/88 (!) 178/85  Pulse: 83 97  Resp: 15 18  Temp:    SpO2: 98% 97%    Last Pain:  Vitals:   07/26/24 1330  TempSrc:   PainSc: Asleep                 Suheyla Mortellaro,E. Tayvion Lauder

## 2024-07-26 NOTE — TOC Initial Note (Signed)
 Transition of Care [facility]) - Initial/Assessment Note    Patient Details  Name: Gregory Parsons MRN: [id number] Date of Birth: 08-Jun-1966  Transition of Care [facility]) CM/SW Contact:    Bascom Service, RN Phone Number: 07/26/2024, 2:46 PM  Clinical Narrative: d/c plan home.                  Expected Discharge Plan: Home/Self Care Barriers to Discharge: Continued Medical Work up   Patient Goals and CMS Choice Patient states their goals for this hospitalization and ongoing recovery are:: Home CMS Medicare.gov Compare Post Acute Care list provided to:: Patient Choice offered to / list presented to : Patient [place] ownership interest in [facility].provided to:: Patient    Expected Discharge Plan and Services                                              Prior Living Arrangements/Services                       Activities of Daily Living   ADL Screening (condition at time of admission) Independently performs ADLs?: Yes (appropriate for developmental age) Is the patient deaf or have difficulty hearing?: No Does the patient have difficulty seeing, even when wearing glasses/contacts?: No Does the patient have difficulty concentrating, remembering, or making decisions?: No  Permission Sought/Granted                  Emotional Assessment              Admission diagnosis:  Prostate cancer [facility]) [C61] Patient Active Problem List   Diagnosis Date Noted   Prostate cancer (HCC) 07/26/2024   Pneumonia due to COVID-19 virus 01/25/2021   Encounter for screening colonoscopy 05/07/2020   Hypertension    History of stomach ulcers    Acid reflux    Chest pain 09/13/2017   Chest pain of unknown etiology 09/13/2017   Abnormal EKG    Precordial pain    PCP:  Jolee Elsie RAMAN, PA Pharmacy:   [facility] [address], [place] - [address] [place] [address] Phone: [phone] Fax: [phone]     Social Drivers of Health  (SDOH) Social History: SDOH Screenings   Food Insecurity: No Food Insecurity (07/26/2024)  Housing: Low Risk  (07/26/2024)  Transportation Needs: No Transportation Needs (07/26/2024)  Utilities: Not At Risk (07/26/2024)  Tobacco Use: Medium Risk (07/26/2024)   SDOH Interventions:     Readmission Risk Interventions     No data to display

## 2024-07-27 ENCOUNTER — Encounter (HOSPITAL_COMMUNITY): Payer: Self-pay | Admitting: Urology

## 2024-07-27 DIAGNOSIS — C61 Malignant neoplasm of prostate: Secondary | ICD-10-CM | POA: Diagnosis not present

## 2024-07-27 LAB — BASIC METABOLIC PANEL WITH GFR
Anion gap: 11 (ref 5–15)
BUN: 11 mg/dL (ref 6–20)
CO2: 23 mmol/L (ref 22–32)
Calcium: 8.9 mg/dL (ref 8.9–10.3)
Chloride: 105 mmol/L (ref 98–111)
Creatinine, Ser: 1.07 mg/dL (ref 0.61–1.24)
GFR, Estimated: >60 mL/min (ref >60)
Glucose, Bld: 115 mg/dL — ABNORMAL HIGH (ref 70–99)
Potassium: 3.7 mmol/L (ref 3.5–5.1)
Sodium: 139 mmol/L (ref 135–145)

## 2024-07-27 LAB — HEMOGLOBIN AND HEMATOCRIT, BLOOD
HCT: 40.7 % (ref 39.0–52.0)
Hemoglobin: 14.1 g/dL (ref 13.0–17.0)

## 2024-07-27 NOTE — Progress Notes (Signed)
 Patient to be discharged to home today. All discharge teaching reviewed with Patient including all discharge Medications and schedules for these Medications. Home care for post op Prostatectomy reviewed with the Patient. Patient verbalized understanding of all discharge teaching. Discharge AVS with the Patient at time of discharge.

## 2024-07-27 NOTE — Plan of Care (Signed)
  Problem: Education: Goal: Knowledge of the procedure and recovery process will improve Outcome: Progressing   Problem: Bowel/Gastric: Goal: Gastrointestinal status for postoperative course will improve Outcome: Progressing   Problem: Pain Management: Goal: General experience of comfort will improve Outcome: Progressing   Problem: Skin Integrity: Goal: Demonstration of wound healing without infection will improve Outcome: Progressing

## 2024-07-27 NOTE — Discharge Summary (Signed)
 Physician Discharge Summary  Patient ID: Gregory Parsons MRN: [id number] DOB/AGE: 59/01/1966 59 y.o.  Admit date: 07/26/2024 Discharge date: 07/27/2024  Admission Diagnoses: Prostate Cancer  Discharge Diagnoses:  Principal Problem:   Prostate cancer Gregory Parsons [facility])   Discharged Condition: good  Hospital Course: PT underwent robotic prostatectomy 07/26/24, the day of admission, without acute complication. Observed on 4th floor Urology service post-op. By the afternoon of POD 1, the day of discharge, he is ambulatory, tolerating PO diet, pain controlled on PO meds, and felt to be adequate for discharge. Hgb 14, Cr 1.07, path pending at discharge. JP removed prior to discharge.   Consults: None  Significant Diagnostic Studies: labs: as per above  Treatments: surgery: as per above  Discharge Exam: Blood pressure 137/89, pulse 77, temperature 98.2 F (36.8 C), temperature source Oral, resp. rate 16, height 5' 4 (1.626 m), weight 97 kg, SpO2 98%.  NAD, very pleasant, disconjuate gaze (stable) NLB-RA Moderate truncal obesity Prior surgical sites c/d/I, JP with scant non-foul serosanguinous output. Removed and dry dressing placed. Foley with non-foul very very light pink urine w/o clots. SCD's in place  Disposition:  HOME   Allergies as of 07/27/2024       Reactions   Lisinopril  Swelling        Medication List     STOP taking these medications    aspirin  EC 81 MG tablet       TAKE these medications    amLODipine 5 MG tablet Commonly known as: NORVASC Take 5 mg by mouth daily.   carvedilol  3.125 MG tablet Commonly known as: COREG  Take 3.125 mg by mouth 2 (two) times daily with a meal.   docusate sodium 100 MG capsule Commonly known as: COLACE Take 1 capsule (100 mg total) by mouth 2 (two) times daily.   HYDROcodone -acetaminophen  5-325 MG tablet Commonly known as: NORCO/VICODIN Take 1-2 tablets by mouth every 6 (six) hours as needed for moderate pain (pain score  4-6) or severe pain (pain score 7-10).   losartan 100 MG tablet Commonly known as: COZAAR Take 100 mg by mouth daily.   pantoprazole 40 MG tablet Commonly known as: PROTONIX Take 40 mg by mouth daily.   sulfamethoxazole-trimethoprim 800-160 MG tablet Commonly known as: BACTRIM DS Take 1 tablet by mouth 2 (two) times daily. Start the day prior to foley removal appointment        Follow-up Information     Gregory Donnice SAUNDERS, MD Follow up on 08/01/2024.   Specialty: Urology Why: at 10:15 Contact information: [address] [place] [address] [phone]                 Signed: Ricardo KATHEE Alvaro Parsons. 07/27/2024, 1:28 PM

## 2024-07-30 LAB — SURGICAL PATHOLOGY
